# Patient Record
Sex: Male | Born: 1960 | Race: White | Hispanic: No | Marital: Married | Smoking: Never smoker
Health system: Southern US, Community
[De-identification: ages and names within clinical notes are randomized; demographics above are authoritative.]

## PROBLEM LIST (undated history)

## (undated) DIAGNOSIS — H6690 Otitis media, unspecified, unspecified ear: Secondary | ICD-10-CM

## (undated) DIAGNOSIS — I10 Essential (primary) hypertension: Secondary | ICD-10-CM

## (undated) DIAGNOSIS — Z9109 Other allergy status, other than to drugs and biological substances: Secondary | ICD-10-CM

## (undated) DIAGNOSIS — F1010 Alcohol abuse, uncomplicated: Secondary | ICD-10-CM

---

## 2015-05-30 ENCOUNTER — Ambulatory Visit
Admission: EM | Admit: 2015-05-30 | Discharge: 2015-05-30 | Disposition: A | Discharge status: Home / Self Care | Payer: BLUE CROSS/BLUE SHIELD | Attending: Family Medicine | Admitting: Family Medicine

## 2015-05-30 ENCOUNTER — Encounter: Payer: Self-pay | Admitting: *Deleted

## 2015-05-30 DIAGNOSIS — I10 Essential (primary) hypertension: Secondary | ICD-10-CM | POA: Diagnosis not present

## 2015-05-30 DIAGNOSIS — T50905A Adverse effect of unspecified drugs, medicaments and biological substances, initial encounter: Secondary | ICD-10-CM

## 2015-05-30 DIAGNOSIS — J302 Other seasonal allergic rhinitis: Secondary | ICD-10-CM | POA: Diagnosis not present

## 2015-05-30 DIAGNOSIS — I158 Other secondary hypertension: Secondary | ICD-10-CM

## 2015-05-30 HISTORY — DX: Otitis media, unspecified, unspecified ear: H66.90

## 2015-05-30 HISTORY — DX: Other allergy status, other than to drugs and biological substances: Z91.09

## 2015-05-30 NOTE — ED Notes (Addendum)
Patient has not be feeling well for the past week and with some light headiness and today he is feeling a little more light headed. Patient does suffer from seasonal allergies and maybe suffering from the effects of flowers in early bloom. Patient has had severe ear infections in the past and is presently feeling some ear pain on the left side.

## 2015-05-30 NOTE — ED Provider Notes (Signed)
CSN: [id number]     Arrival date & time 05/30/15  1941 History   First MD Initiated Contact with Patient 05/30/15 2232     Chief Complaint  Patient presents with  . Hypertension   (Consider location/radiation/quality/duration/timing/severity/associated sxs/prior Treatment) HPI   Patient presents along with his wife for complaint hasn't been feeling well for the past week with complaints of lightheadedness and today of feeling of more lightheadedness with some tingling in his right upper arm. He has an extensive past history of allergies and recently started taking Allegra-D. Staking the Allegra-D he has noticed his symptoms as well as red blood pressures that he is taken at stores. Became alarmed and decided to come in with his wife. He's been taking the Allegra-D along with over-the-counter nasal sprays mostly saline because of his severe allergies. 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  Past Medical History  Diagnosis Date  . Environmental allergies   . Ear infection    History reviewed. No pertinent past surgical history. Family History  Problem Relation Age of Onset  . Hypertension Mother   . Hypertension Father    Social History  Substance Use Topics  . Smoking status: Never Smoker   . Smokeless tobacco: Current User  . Alcohol Use: Yes    Review of Systems  Constitutional: Positive for activity change. Negative for fever, chills and fatigue.  HENT: Positive for congestion and ear pain.   Allergic/Immunologic: Positive for environmental allergies.  All other systems reviewed and are negative.   Allergies  Review of patient's allergies indicates no known allergies.  Home Medications   Prior to Admission medications   Not on File   Meds Ordered and Administered this Visit  Medications - No data to display  BP 178/93  mmHg  Pulse 93  Temp(Src) 98.1 F (36.7 C) (Oral)  Resp 18  Ht  (1.702 m)  Wt 160 lb (72.576 kg)  BMI 25.05 kg/m2  SpO2 97% No data found.   Physical Exam  Constitutional: He is oriented to person, place, and time. He appears well-developed and well-nourished. No distress.  HENT:  Head: Normocephalic and atraumatic.  Nose: Nose normal.  Mouth/Throat: Oropharynx is clear and moist. No oropharyngeal exudate.  Both TMs are dull  Eyes: Conjunctivae are normal. Pupils are equal, round, and reactive to light.  Neck: Normal range of motion. Neck supple.  Musculoskeletal: Normal range of motion. He exhibits no edema or tenderness.  Neurological: He is alert and oriented to person, place, and time.  Skin: Skin is warm and dry. He is not diaphoretic.  Psychiatric: He has a normal mood and affect. His behavior is normal. Judgment and thought content normal.  Nursing note and vitals reviewed.   ED Course  Procedures (including critical care time)  Labs Review Labs Reviewed - No data to display  Imaging Review No results found.   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 Seasonal allergic rhinitis    Discussion with the patient and his wife. It is likely that his hypertension is from the effects of the  Allegra-D pseudoephedrine component. I recommended that he stop this immediately and to monitor his blood pressures on a 3 times a week basis only. For his seasonal allergies but also recommended highly that he consider using Flonase and Zyrtec plain along with a Nettie pot once every day prior to using the Flonase. His pressures do not come down I recommended that he be seen by a primary care physician for evaluation and treatment as necessary. His ear problem also recommended that he will return to his ENT to allow them to  reevaluate him and his hearing. No prescription medications were provided since everything is over-the-counter that I recommended.    Lutricia Feil, PA-C 05/30/15 2251

## 2015-05-30 NOTE — Discharge Instructions (Signed)
Allergic Rhinitis Allergic rhinitis is when the mucous membranes in the nose respond to allergens. Allergens are particles in the air that cause your body to have an allergic reaction. This causes you to release allergic antibodies. Through a chain of events, these eventually cause you to release histamine into the blood stream. Although meant to protect the body, it is this release of histamine that causes your discomfort, such as frequent sneezing, congestion, and an itchy, runny nose.  CAUSES Seasonal allergic rhinitis (hay fever) is caused by pollen allergens that may come from grasses, trees, and weeds. Year-round allergic rhinitis (perennial allergic rhinitis) is caused by allergens such as house dust mites, pet dander, and mold spores. SYMPTOMS  Nasal stuffiness (congestion).  Itchy, runny nose with sneezing and tearing of the eyes. DIAGNOSIS Your health care provider can help you determine the allergen or allergens that trigger your symptoms. If you and your health care provider are unable to determine the allergen, skin or blood testing may be used. Your health care provider will diagnose your condition after taking your health history and performing a physical exam. Your health care provider may assess you for other related conditions, such as asthma, pink eye, or an ear infection. TREATMENT Allergic rhinitis does not have a cure, but it can be controlled by:  Medicines that block allergy symptoms. These may include allergy shots, nasal sprays, and oral antihistamines.  Avoiding the allergen. Hay fever may often be treated with antihistamines in pill or nasal spray forms. Antihistamines block the effects of histamine. There are over-the-counter medicines that may help with nasal congestion and swelling around the eyes. Check with your health care provider before taking or giving this medicine. If avoiding the allergen or the medicine prescribed do not work, there are many new medicines  your health care provider can prescribe. Stronger medicine may be used if initial measures are ineffective. Desensitizing injections can be used if medicine and avoidance does not work. Desensitization is when a patient is given ongoing shots until the body becomes less sensitive to the allergen. Make sure you follow up with your health care provider if problems continue. HOME CARE INSTRUCTIONS It is not possible to completely avoid allergens, but you can reduce your symptoms by taking steps to limit your exposure to them. It helps to know exactly what you are allergic to so that you can avoid your specific triggers. SEEK MEDICAL CARE IF:  You have a fever.  You develop a cough that does not stop easily (persistent).  You have shortness of breath.  You start wheezing.  Symptoms interfere with normal daily activities.   This information is not intended to replace advice given to you by your health care provider. Make sure you discuss any questions you have with your health care provider.   Document Released: 12/22/2000 Document Revised: 04/19/2014 Document Reviewed: 12/04/2012 Elsevier Interactive Patient Education [employer]  Allergies An allergy is an abnormal reaction to a substance by the body's defense system (immune system). Allergies can develop at any age. WHAT CAUSES ALLERGIES? An allergic reaction happens when the immune system mistakenly reacts to a normally harmless substance, called an allergen, as if it were harmful. The immune system releases antibodies to fight the substance. Antibodies eventually release a chemical called histamine into the bloodstream. The release of histamine is meant to protect the body from infection, but it also causes discomfort. An allergic reaction can be triggered by:  Eating an allergen.  Inhaling an allergen.  Touching  an allergen. WHAT TYPES OF ALLERGIES ARE THERE? There are many types of allergies. Common types  include:  Seasonal allergies. People with this type of allergy are usually allergic to substances that are only present during certain seasons, such as molds and pollens.  Food allergies.  Drug allergies.  Insect allergies.  Animal dander allergies. WHAT ARE SYMPTOMS OF ALLERGIES? Possible allergy symptoms include:  Swelling of the lips, face, tongue, mouth, or throat.  Sneezing, coughing, or wheezing.  Nasal congestion.  Tingling in the mouth.  Rash.  Itching.  Itchy, red, swollen areas of skin (hives).  Watery eyes.  Vomiting.  Diarrhea.  Dizziness.  Lightheadedness.  Fainting.  Trouble breathing or swallowing.  Chest tightness.  Rapid heartbeat. HOW ARE ALLERGIES DIAGNOSED? Allergies are diagnosed with a medical and family history and one or more of the following:  Skin tests.  Blood tests.  A food diary. A food diary is a record of all the foods and drinks you have in a day and of all the symptoms you experience.  The results of an elimination diet. An elimination diet involves eliminating foods from your diet and then adding them back in one by one to find out if a certain food causes an allergic reaction. HOW ARE ALLERGIES TREATED? There is no cure for allergies, but allergic reactions can be treated with medicine. Severe reactions usually need to be treated at a hospital. HOW CAN REACTIONS BE PREVENTED? The best way to prevent an allergic reaction is by avoiding the substance you are allergic to. Allergy shots and medicines can also help prevent reactions in some cases. People with severe allergic reactions may be able to prevent a life-threatening reaction called anaphylaxis with a medicine given right after exposure to the allergen.   This information is not intended to replace advice given to you by your health care provider. Make sure you discuss any questions you have with your health care provider.   Document Released: 06/22/2002 Document  Revised: 04/19/2014 Document Reviewed: 01/08/2014 Elsevier Interactive Patient Education 2016 [employer].  Hypertension Hypertension, commonly called high blood pressure, is when the force of blood pumping through your arteries is too strong. Your arteries are the blood vessels that carry blood from your heart throughout your body. A blood pressure reading consists of a higher number over a lower number, such as 110/72. The higher number (systolic) is the pressure inside your arteries when your heart pumps. The lower number (diastolic) is the pressure inside your arteries when your heart relaxes. Ideally you want your blood pressure below 120/80. Hypertension forces your heart to work harder to pump blood. Your arteries may become narrow or stiff. Having untreated or uncontrolled hypertension can cause heart attack, stroke, kidney disease, and other problems. RISK FACTORS Some risk factors for high blood pressure are controllable. Others are not.  Risk factors you cannot control include:   Race. You may be at higher risk if you are African American.  Age. Risk increases with age.  Gender. Men are at higher risk than women before age 8 years. After age 64, women are at higher risk than men. Risk factors you can control include:  Not getting enough exercise or physical activity.  Being overweight.  Getting too much fat, sugar, calories, or salt in your diet.  Drinking too much alcohol. SIGNS AND SYMPTOMS Hypertension does not usually cause signs or symptoms. Extremely high blood pressure (hypertensive crisis) may cause headache, anxiety, shortness of breath, and nosebleed. DIAGNOSIS To check if  you have hypertension, your health care provider will measure your blood pressure while you are seated, with your arm held at the level of your heart. It should be measured at least twice using the same arm. Certain conditions can cause a difference in blood pressure between your right and left  arms. A blood pressure reading that is higher than normal on one occasion does not mean that you need treatment. If it is not clear whether you have high blood pressure, you may be asked to return on a different day to have your blood pressure checked again. Or, you may be asked to monitor your blood pressure at home for 1 or more weeks. TREATMENT Treating high blood pressure includes making lifestyle changes and possibly taking medicine. Living a healthy lifestyle can help lower high blood pressure. You may need to change some of your habits. Lifestyle changes may include:  Following the DASH diet. This diet is high in fruits, vegetables, and whole grains. It is low in salt, red meat, and added sugars.  Keep your sodium intake below 2,300 mg per day.  Getting at least 30-45 minutes of aerobic exercise at least 4 times per week.  Losing weight if necessary.  Not smoking.  Limiting alcoholic beverages.  Learning ways to reduce stress. Your health care provider may prescribe medicine if lifestyle changes are not enough to get your blood pressure under control, and if one of the following is true:  You are 5-50 years of age and your systolic blood pressure is above 140.  You are 51 years of age or older, and your systolic blood pressure is above 150.  Your diastolic blood pressure is above 90.  You have diabetes, and your systolic blood pressure is over 140 or your diastolic blood pressure is over 90.  You have kidney disease and your blood pressure is above 140/90.  You have heart disease and your blood pressure is above 140/90. Your personal target blood pressure may vary depending on your medical conditions, your age, and other factors. HOME CARE INSTRUCTIONS  Have your blood pressure rechecked as directed by your health care provider.   Take medicines only as directed by your health care provider. Follow the directions carefully. Blood pressure medicines must be taken as  prescribed. The medicine does not work as well when you skip doses. Skipping doses also puts you at risk for problems.  Do not smoke.   Monitor your blood pressure at home as directed by your health care provider. SEEK MEDICAL CARE IF:   You think you are having a reaction to medicines taken.  You have recurrent headaches or feel dizzy.  You have swelling in your ankles.  You have trouble with your vision. SEEK IMMEDIATE MEDICAL CARE IF:  You develop a severe headache or confusion.  You have unusual weakness, numbness, or feel faint.  You have severe chest or abdominal pain.  You vomit repeatedly.  You have trouble breathing. MAKE SURE YOU:   Understand these instructions.  Will watch your condition.  Will get help right away if you are not doing well or get worse.   This information is not intended to replace advice given to you by your health care provider. Make sure you discuss any questions you have with your health care provider.   Document Released: 03/29/2005 Document Revised: 08/13/2014 Document Reviewed: 01/19/2013 Elsevier Interactive Patient Education [employer]

## 2016-06-05 ENCOUNTER — Emergency Department
Admission: EM | Admit: 2016-06-05 | Discharge: 2016-06-05 | Disposition: A | Discharge status: Home / Self Care | Payer: BLUE CROSS/BLUE SHIELD | Attending: Emergency Medicine | Admitting: Emergency Medicine

## 2016-06-05 DIAGNOSIS — I10 Essential (primary) hypertension: Secondary | ICD-10-CM | POA: Diagnosis not present

## 2016-06-05 DIAGNOSIS — R42 Dizziness and giddiness: Secondary | ICD-10-CM | POA: Diagnosis present

## 2016-06-05 DIAGNOSIS — R791 Abnormal coagulation profile: Secondary | ICD-10-CM | POA: Diagnosis not present

## 2016-06-05 DIAGNOSIS — F17200 Nicotine dependence, unspecified, uncomplicated: Secondary | ICD-10-CM | POA: Insufficient documentation

## 2016-06-05 LAB — COMPREHENSIVE METABOLIC PANEL
ALBUMIN: 4.5 g/dL (ref 3.5–5.0)
ALK PHOS: 94 U/L (ref 38–126)
ALT: 37 U/L (ref 17–63)
ANION GAP: 10 (ref 5–15)
AST: 34 U/L (ref 15–41)
BUN: 18 mg/dL (ref 6–20)
CO2: 26 mmol/L (ref 22–32)
Calcium: 9.1 mg/dL (ref 8.9–10.3)
Chloride: 101 mmol/L (ref 101–111)
Creatinine, Ser: 1.06 mg/dL (ref 0.61–1.24)
GFR calc Af Amer: >60 mL/min (ref >60)
GFR calc non Af Amer: >60 mL/min (ref >60)
GLUCOSE: 150 mg/dL — AB (ref 65–99)
POTASSIUM: 3.6 mmol/L (ref 3.5–5.1)
SODIUM: 137 mmol/L (ref 135–145)
Total Bilirubin: 0.6 mg/dL (ref 0.3–1.2)
Total Protein: 7.5 g/dL (ref 6.5–8.1)

## 2016-06-05 LAB — CBC
HCT: 43.2 % (ref 40.0–52.0)
Hemoglobin: 15.3 g/dL (ref 13.0–18.0)
MCH: 35.5 pg — AB (ref 26.0–34.0)
MCHC: 35.3 g/dL (ref 32.0–36.0)
MCV: 100.7 fL — ABNORMAL HIGH (ref 80.0–100.0)
PLATELETS: 234 10*3/uL (ref 150–440)
RBC: 4.29 MIL/uL — AB (ref 4.40–5.90)
RDW: 13.5 % (ref 11.5–14.5)
WBC: 6.1 10*3/uL (ref 3.8–10.6)

## 2016-06-05 LAB — DIFFERENTIAL
Basophils Absolute: 0.1 10*3/uL (ref 0–0.1)
Basophils Relative: 1 %
EOS PCT: 4 %
Eosinophils Absolute: 0.2 10*3/uL (ref 0–0.7)
LYMPHS PCT: 41 %
Lymphs Abs: 2.5 10*3/uL (ref 1.0–3.6)
Monocytes Absolute: 0.7 10*3/uL (ref 0.2–1.0)
Monocytes Relative: 12 %
NEUTROS PCT: 42 %
Neutro Abs: 2.6 10*3/uL (ref 1.4–6.5)

## 2016-06-05 LAB — APTT: APTT: 26 s (ref 24–36)

## 2016-06-05 LAB — PROTIME-INR
INR: 0.86
Prothrombin Time: 11.7 seconds (ref 11.4–15.2)

## 2016-06-05 LAB — TROPONIN I: Troponin I: <0.03 ng/mL (ref <0.03)

## 2016-06-05 MED ORDER — HYDROCHLOROTHIAZIDE 25 MG PO TABS: 25.0000 mg | ORAL_TABLET | Freq: Every day | ORAL | 0 refills | Status: AC

## 2016-06-05 MED ORDER — HYDRALAZINE HCL 20 MG/ML IJ SOLN
5.0000 mg | Freq: Once | INTRAMUSCULAR | Status: AC
  Administered 2016-06-05: 5 mg via INTRAVENOUS
  Filled 2016-06-05: qty 1

## 2016-06-05 MED ORDER — LABETALOL HCL 5 MG/ML IV SOLN
10.0000 mg | Freq: Once | INTRAVENOUS | Status: DC
Start: 2016-06-05 — End: 2016-06-05
  Filled 2016-06-05: qty 4

## 2016-06-05 MED ORDER — SODIUM CHLORIDE 0.9 % IV SOLN
1000.0000 mL | Freq: Once | INTRAVENOUS | Status: AC
  Administered 2016-06-05: 1000 mL via INTRAVENOUS

## 2016-06-05 NOTE — ED Triage Notes (Signed)
[place] diz[place] after he arrived; dizziness started after he got of the car and started walking; c/o pressure in his head; pt pauses before answering, which wife says is his normal communication, but no slurred speech or difficulty speaking; pt awake and alert; taken to treatment room 15 via wheelchair; pt hypertensive in triage, which he has a history of, but says it's never been that high

## 2016-06-05 NOTE — ED Notes (Signed)
Spoke with dr. Cyril Loosenkinner regarding pt's hr in upper 50s to low 60s and order for labetalol. md discontinued labetalol and ordered hydralazine after discussion with this rn.

## 2016-06-05 NOTE — ED Provider Notes (Signed)
[facility] Emergency Department Provider Note   ____________________________________________    I have reviewed the triage vital signs and the nursing notes.   HISTORY  Chief Complaint Dizziness     HPI Vernon Bentley is a 56 y.o. male who presents with dizziness. Patient reports he developed lightheadedness after getting out of his car to go watch his niece's dance recital. He reports he felt lightheaded while sitting and watching after about 10 minutes he and his wife decided he needed to be evaluated. He denies chest pain, palpitations, shortness of breath. No calf pain or swelling. No recent travel. He has never had this before. He does have a history of vertigo but he reports this is different. No neuro deficits. No fevers or chills.   Past Medical History:  Diagnosis Date  . Ear infection   . Environmental allergies     There are no active problems to display for this patient.   No past surgical history on file.  Prior to Admission medications   Medication Sig Start Date End Date Taking? Authorizing Provider  hydrochlorothiazide (HYDRODIURIL) 25 MG tablet Take 1 tablet (25 mg total) by mouth daily. 06/05/16   Jene Every, MD     Allergies Patient has no known allergies.  Family History  Problem Relation Age of Onset  . Hypertension Mother   . Hypertension Father     Social History Social History  Substance Use Topics  . Smoking status: Never Smoker  . Smokeless tobacco: Current User  . Alcohol use Yes    Review of Systems  Constitutional: No fever/chills Eyes: No visual changes.   Cardiovascular: Denies chest pain. Respiratory: Denies shortness of breath. Gastrointestinal: No abdominal pain.  No nausea, no vomiting.    Musculoskeletal: Negative for back pain. Skin: Negative for rash. Neurological: Negative for headaches or weakness  10-point ROS otherwise  negative.  ____________________________________________   PHYSICAL EXAM:  VITAL SIGNS: ED Triage Vitals  Enc Vitals Group     BP 06/05/16 2008 (!) 208/93     Pulse Rate 06/05/16 2008 75     Resp 06/05/16 2008 18     Temp 06/05/16 2008 98.3 F (36.8 C)     Temp Source 06/05/16 2008 Oral     SpO2 06/05/16 2008 99 %     Weight 06/05/16 2008 165 lb (74.8 kg)     Height 06/05/16 2008 5\' 7"  (1.702 m)     Head Circumference --      Peak Flow --      Pain Score 06/05/16 2009 7     Pain Loc --      Pain Edu? --      Excl. in GC? --     Constitutional: Alert and oriented. No acute distress. Pleasant and interactive Eyes: Conjunctivae are normal.   Nose: No congestion/rhinnorhea. Mouth/Throat: Mucous membranes are moist.    Cardiovascular: Normal rate, regular rhythm. Grossly normal heart sounds.  Good peripheral circulation. Respiratory: Normal respiratory effort.  No retractions. Lungs CTAB. Gastrointestinal: Soft and nontender. No distention.  No CVA tenderness. Genitourinary: deferred Musculoskeletal: No lower extremity tenderness nor edema.  Warm and well perfused Neurologic:  Normal speech and language. No gross focal neurologic deficits are appreciated.  Skin:  Skin is warm, dry and intact. No rash noted. Psychiatric: Mood and affect are normal. Speech and behavior are normal.  ____________________________________________   LABS (all labs ordered are listed, but only abnormal results are displayed)  Labs Reviewed  CBC -  Abnormal; Notable for the following:       Result Value   RBC 4.29 (*)    MCV 100.7 (*)    MCH 35.5 (*)    All other components within normal limits  COMPREHENSIVE METABOLIC PANEL - Abnormal; Notable for the following:    Glucose, Bld 150 (*)    All other components within normal limits  DIFFERENTIAL  TROPONIN I  PROTIME-INR  APTT   ____________________________________________  EKG  ED ECG REPORT I, Jene EveryKINNER, Aleisa Howk, the attending  physician, personally viewed and interpreted this ECG.  Date: 06/05/2016  Rhythm: normal sinus rhythm QRS Axis: normal Intervals: normal ST/T Wave abnormalities: normal Conduction Disturbances: none Narrative Interpretation: unremarkable  ____________________________________________  RADIOLOGY  None ____________________________________________   PROCEDURES  Procedure(s) performed: No    Critical Care performed: No ____________________________________________   INITIAL IMPRESSION / ASSESSMENT AND PLAN / ED COURSE  Pertinent labs & imaging results that were available during my care of the patient were reviewed by me and considered in my medical decision making (see chart for details).  Patient presents with lightheadedness. EKG is reassuring. He is hypertensive and denies a history of such but he has not had it checked recently and does not see a PCP routinely. No chest pain palpitations or arrhythmia. Lab work is reassuring. He was treated with a liter of IV fluids and reports "dramatic improvement ". His blood pressure is improved with IV hydralazine. I'll discharge with hydrochlorothiazide instructions to follow-up with PCP for blood pressure recheck. Return precautions discussed, patient and wife feel comfortable with the plan    ____________________________________________   FINAL CLINICAL IMPRESSION(S) / ED DIAGNOSES  Final diagnoses:  Lightheadedness  Hypertension, unspecified type      NEW MEDICATIONS STARTED DURING THIS VISIT:  Discharge Medication List as of 06/05/2016 10:40 PM    START taking these medications   Details  hydrochlorothiazide (HYDRODIURIL) 25 MG tablet Take 1 tablet (25 mg total) by mouth daily., Starting Sat 06/05/2016, Print         Note:  This document was prepared using Dragon voice recognition software and may include unintentional dictation errors.    Jene Everyobert Niveah Boerner, MD 06/05/16 [phone]

## 2017-08-27 ENCOUNTER — Other Ambulatory Visit: Payer: Self-pay

## 2017-08-27 ENCOUNTER — Encounter: Payer: Self-pay | Admitting: Emergency Medicine

## 2017-08-27 ENCOUNTER — Emergency Department
Admission: EM | Admit: 2017-08-27 | Discharge: 2017-08-27 | Disposition: A | Discharge status: Home / Self Care | Payer: BLUE CROSS/BLUE SHIELD | Attending: Emergency Medicine | Admitting: Emergency Medicine

## 2017-08-27 ENCOUNTER — Emergency Department: Payer: BLUE CROSS/BLUE SHIELD

## 2017-08-27 DIAGNOSIS — Z79899 Other long term (current) drug therapy: Secondary | ICD-10-CM | POA: Diagnosis not present

## 2017-08-27 DIAGNOSIS — E860 Dehydration: Secondary | ICD-10-CM | POA: Diagnosis not present

## 2017-08-27 DIAGNOSIS — I10 Essential (primary) hypertension: Secondary | ICD-10-CM | POA: Diagnosis not present

## 2017-08-27 DIAGNOSIS — F10220 Alcohol dependence with intoxication, uncomplicated: Secondary | ICD-10-CM | POA: Diagnosis not present

## 2017-08-27 DIAGNOSIS — R55 Syncope and collapse: Secondary | ICD-10-CM | POA: Diagnosis not present

## 2017-08-27 DIAGNOSIS — R110 Nausea: Secondary | ICD-10-CM | POA: Insufficient documentation

## 2017-08-27 DIAGNOSIS — F17228 Nicotine dependence, chewing tobacco, with other nicotine-induced disorders: Secondary | ICD-10-CM | POA: Diagnosis not present

## 2017-08-27 DIAGNOSIS — R42 Dizziness and giddiness: Secondary | ICD-10-CM | POA: Insufficient documentation

## 2017-08-27 HISTORY — DX: Alcohol abuse, uncomplicated: F10.10

## 2017-08-27 HISTORY — DX: Essential (primary) hypertension: I10

## 2017-08-27 LAB — COMPREHENSIVE METABOLIC PANEL
ALBUMIN: 4.5 g/dL (ref 3.5–5.0)
ALK PHOS: 74 U/L (ref 38–126)
ALT: 71 U/L — AB (ref 17–63)
AST: 57 U/L — AB (ref 15–41)
Anion gap: 15 (ref 5–15)
BILIRUBIN TOTAL: 0.9 mg/dL (ref 0.3–1.2)
BUN: 25 mg/dL — AB (ref 6–20)
CALCIUM: 8.9 mg/dL (ref 8.9–10.3)
CO2: 23 mmol/L (ref 22–32)
CREATININE: 1.13 mg/dL (ref 0.61–1.24)
Chloride: 96 mmol/L — ABNORMAL LOW (ref 101–111)
GFR calc Af Amer: >60 mL/min (ref >60)
GLUCOSE: 90 mg/dL (ref 65–99)
POTASSIUM: 4 mmol/L (ref 3.5–5.1)
Sodium: 134 mmol/L — ABNORMAL LOW (ref 135–145)
TOTAL PROTEIN: 7.4 g/dL (ref 6.5–8.1)

## 2017-08-27 LAB — URINALYSIS, COMPLETE (UACMP) WITH MICROSCOPIC
Bacteria, UA: NONE SEEN
Bilirubin Urine: NEGATIVE
GLUCOSE, UA: NEGATIVE mg/dL
Hgb urine dipstick: NEGATIVE
Ketones, ur: 20 mg/dL — AB
Leukocytes, UA: NEGATIVE
Nitrite: NEGATIVE
PH: 6 (ref 5.0–8.0)
Protein, ur: NEGATIVE mg/dL
SPECIFIC GRAVITY, URINE: 1.015 (ref 1.005–1.030)
Squamous Epithelial / LPF: NONE SEEN (ref 0–5)

## 2017-08-27 LAB — URINE DRUG SCREEN, QUALITATIVE (ARMC ONLY)
Amphetamines, Ur Screen: NOT DETECTED
BARBITURATES, UR SCREEN: NOT DETECTED
Benzodiazepine, Ur Scrn: NOT DETECTED
CANNABINOID 50 NG, UR ~~LOC~~: NOT DETECTED
Cocaine Metabolite,Ur ~~LOC~~: NOT DETECTED
MDMA (ECSTASY) UR SCREEN: NOT DETECTED
Methadone Scn, Ur: NOT DETECTED
Opiate, Ur Screen: NOT DETECTED
PHENCYCLIDINE (PCP) UR S: NOT DETECTED
TRICYCLIC, UR SCREEN: NOT DETECTED

## 2017-08-27 LAB — CBC
HCT: 38.5 % — ABNORMAL LOW (ref 40.0–52.0)
HEMOGLOBIN: 13.8 g/dL (ref 13.0–18.0)
MCH: 35.6 pg — AB (ref 26.0–34.0)
MCHC: 35.8 g/dL (ref 32.0–36.0)
MCV: 99.4 fL (ref 80.0–100.0)
PLATELETS: 222 10*3/uL (ref 150–440)
RBC: 3.87 MIL/uL — AB (ref 4.40–5.90)
RDW: 16.7 % — ABNORMAL HIGH (ref 11.5–14.5)
WBC: 4.6 10*3/uL (ref 3.8–10.6)

## 2017-08-27 LAB — ETHANOL: Alcohol, Ethyl (B): 106 mg/dL — ABNORMAL HIGH (ref <10)

## 2017-08-27 MED ORDER — CHLORDIAZEPOXIDE HCL 25 MG PO CAPS: ORAL_CAPSULE | ORAL | 0 refills | Status: AC

## 2017-08-27 MED ORDER — THIAMINE HCL 100 MG/ML IJ SOLN
Freq: Once | INTRAVENOUS | Status: AC
  Administered 2017-08-27: 17:00:00 via INTRAVENOUS
  Filled 2017-08-27: qty 1000

## 2017-08-27 NOTE — ED Triage Notes (Addendum)
Pt reports has been feeling [place]  Has had nausea.  Denies SHOB or vomiting.  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  Has been drinking a pint of vodka every day to two days.  Last drink yesterday early afternoon.  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  Denies SI at this time.  Reports did drink some water at street [place] but not a lot.

## 2017-08-27 NOTE — ED Provider Notes (Signed)
[facility] Emergency Department Provider Note  ____________________________________________  Time seen: Approximately 5:18 PM  I have reviewed the triage vital signs and the nursing notes.   HISTORY  Chief Complaint Dizziness   HPI Vernon Bentley is a 57 y.o. male with a history of alcohol abuse and hypertension who presents for evaluation of near syncopal episode.  [place]  It was 53F degrees outside and he barely had anything to drink. He was out for a few hours when he started feeling dizzy/ lightheaded and nauseated like he was going to pass out.  He sat down for a while but his symptoms did not get better which prompted his visit to the emergency room.  Patient denies LOC.  He denies headache, chest pain, shortness of breath, abdominal pain, nausea, vomiting, diarrhea.  Patient reports that 3 weeks ago he relapsed after being sober for a month.  He drinks a pint of vodka every 1 to 2 days.  His last drink was yesterday evening.  He has a history of anxiety and tremors with detox but never had seizures.  Patient has been admitted several times to private inpatient detox facilities and usually spends several weeks there before being discharged home.  Patient denies anxiety, shakes, hallucinations, nausea, headache at this time.  He denies any other drug use.   Past Medical History:  Diagnosis Date  . Alcohol abuse   . Ear infection   . Environmental allergies   . Hypertension     Prior to Admission medications   Medication Sig Start Date End Date Taking? Authorizing Provider  chlordiazePOXIDE (LIBRIUM) 25 MG capsule 50 mg 3 times a day x 3 days 50 mg 2 times a day for 3 days 50 mg once a day for 3 days stop 08/27/17   Don Perking, [place], MD  hydrochlorothiazide (HYDRODIURIL) 25 MG tablet Take 1 tablet (25 mg total) by mouth daily. 06/05/16   Jene Every, MD    Allergies Patient has no known allergies.  Family  History  Problem Relation Age of Onset  . Hypertension Mother   . Hypertension Father     Social History Social History   Tobacco Use  . Smoking status: Never Smoker  . Smokeless tobacco: Current User  Substance Use Topics  . Alcohol use: Yes  . Drug use: No    Review of Systems  Constitutional: Negative for fever. + near syncope Eyes: Negative for visual changes. ENT: Negative for sore throat. Neck: No neck pain  Cardiovascular: Negative for chest pain. Respiratory: Negative for shortness of breath. Gastrointestinal: Negative for abdominal pain, vomiting or diarrhea. Genitourinary: Negative for dysuria. Musculoskeletal: Negative for back pain. Skin: Negative for rash. Neurological: Negative for headaches, weakness or numbness. Psych: No SI or HI  ____________________________________________   PHYSICAL EXAM:  VITAL SIGNS: ED Triage Vitals  Enc Vitals Group     BP 08/27/17 1623 133/60     Pulse Rate 08/27/17 1623 74     Resp 08/27/17 1623 16     Temp 08/27/17 1623 98.4 F (36.9 C)     Temp Source 08/27/17 1623 Oral     SpO2 08/27/17 1623 99 %     Weight 08/27/17 1624 155 lb (70.3 kg)     Height 08/27/17 1624  (1.702 m)     Head Circumference --      Peak Flow --      Pain Score 08/27/17 1624 0  Pain Loc --      Pain Edu? --      Excl. in GC? --     Constitutional: Alert and oriented. Well appearing and in no apparent distress. HEENT:      Head: Normocephalic and atraumatic.         Eyes: Conjunctivae are normal. Sclera is non-icteric.       Mouth/Throat: Mucous membranes are moist.       Neck: Supple with no signs of meningismus. Cardiovascular: Regular rate and rhythm. No murmurs, gallops, or rubs. 2+ symmetrical distal pulses are present in all extremities. No JVD. Respiratory: Normal respiratory effort. Lungs are clear to auscultation bilaterally. No wheezes, crackles, or rhonchi.  Gastrointestinal: Soft, non tender, and non distended with  positive bowel sounds. No rebound or guarding. Musculoskeletal: Nontender with normal range of motion in all extremities. No edema, cyanosis, or erythema of extremities. There is deformity of the R little finger at PIP joint Neurologic: Normal speech and language. Face is symmetric. Moving all extremities. No gross focal neurologic deficits are appreciated. Skin: Skin is warm, dry and intact. No rash noted. Psychiatric: Mood and affect are normal. Speech and behavior are normal.  ____________________________________________   LABS (all labs ordered are listed, but only abnormal results are displayed)  Labs Reviewed  CBC - Abnormal; Notable for the following components:      Result Value   RBC 3.87 (*)    HCT 38.5 (*)    MCH 35.6 (*)    RDW 16.7 (*)    All other components within normal limits  URINALYSIS, COMPLETE (UACMP) WITH MICROSCOPIC - Abnormal; Notable for the following components:   Color, Urine YELLOW (*)    APPearance CLEAR (*)    Ketones, ur 20 (*)    All other components within normal limits  ETHANOL - Abnormal; Notable for the following components:   Alcohol, Ethyl (B) 106 (*)    All other components within normal limits  COMPREHENSIVE METABOLIC PANEL - Abnormal; Notable for the following components:   Sodium 134 (*)    Chloride 96 (*)    BUN 25 (*)    AST 57 (*)    ALT 71 (*)    All other components within normal limits  URINE DRUG SCREEN, QUALITATIVE ([facility] ONLY)   ____________________________________________  EKG  ED ECG REPORT I, Nita Sickle, the attending physician, personally viewed and interpreted this ECG.  Normal sinus rhythm, rate of 64, normal intervals, normal axis, no ST elevations or depressions.  Normal EKG. ____________________________________________  RADIOLOGY  none  ____________________________________________   PROCEDURES  Procedure(s) performed: None Procedures Critical Care performed:   None ____________________________________________   INITIAL IMPRESSION / ASSESSMENT AND PLAN / ED COURSE  57 y.o. male with a history of alcohol abuse and hypertension who presents for evaluation of near syncopal episode.  Patient is extremely well-appearing, no distress, has normal vital signs, neurologically intact, EKG with no evidence of arrhythmia or ischemia.  Differential diagnosis is broad and includes dehydration, vasovagal, possible electrolyte abnormalities causing cardiac arrhythmia since patient is an alcoholic.  Orthostatic vital signs were negative.  There are no clinical signs of ACS or stroke at this time.  Will check labs to rule out electrolyte abnormalities, anemia, dehydration, acute kidney injury.no clinical signs of withdrawal, CIWA 0. Will give a banana bag and monitor on telemetry.    _________________________ 6:50 PM on 08/27/2017 ----------------------------------------- Labs showing mild ketones, negative drug screen, no evidence of anemia or electrolyte abnormalities, alcohol level  of 106, mildly elevated LFTs.  Patient received a liter of banana bag. He continues to have CIWA of 0 with no signs of withdrawal.  No dysrhythmias observed on telemetry.  At this time, he is stable for discharge.  I will provide his wife with a prescription for Librium taper so patient can start detox over the weekend until the wife can find him placement on Monday.  I explained to the patient and the wife that mixing alcohol with Librium can cause severe respiratory depression which can cause death.  The wife says that she will keep the prescription with herself and monitor patient.  Patient is aware of the dangers and understands the risks of mixing both of him.  I also explained to him that his liver is already showing signs of injury from alcohol abuse.  I explained to the patient what happens when patients develop cirrhosis and I highly encourage him to try to remain sober.  Discussed signs  and symptoms of alcohol withdrawal and recommended return to the emergency room if these develop.  At this time patient is stable for discharge home to the care of his wife.    As part of my medical decision making, I reviewed the following data within the electronic MEDICAL RECORD NUMBER History obtained from family, Nursing notes reviewed and incorporated, Labs reviewed , EKG interpreted , Old chart reviewed, Notes from prior ED visits and [place] Controlled Substance Database    Pertinent labs & imaging results that were available during my care of the patient were reviewed by me and considered in my medical decision making (see chart for details).    ____________________________________________   FINAL CLINICAL IMPRESSION(S) / ED DIAGNOSES  Final diagnoses:  Near syncope  Dehydration  Alcohol dependence with acute alcoholic intoxication without complication (HCC)      NEW MEDICATIONS STARTED DURING THIS VISIT:  ED Discharge Orders        Ordered    chlordiazePOXIDE (LIBRIUM) 25 MG capsule     08/27/17 1850       Note:  This document was prepared using Dragon voice recognition software and may include unintentional dictation errors.    Nita Sickle, MD 08/27/17 [phone]

## 2017-10-06 ENCOUNTER — Other Ambulatory Visit: Payer: Self-pay

## 2017-10-06 ENCOUNTER — Encounter: Payer: Self-pay | Admitting: Emergency Medicine

## 2017-10-06 ENCOUNTER — Emergency Department: Payer: BLUE CROSS/BLUE SHIELD

## 2017-10-06 ENCOUNTER — Emergency Department
Admission: EM | Admit: 2017-10-06 | Discharge: 2017-10-06 | Disposition: A | Discharge status: Home / Self Care | Payer: BLUE CROSS/BLUE SHIELD | Attending: Emergency Medicine | Admitting: Emergency Medicine

## 2017-10-06 DIAGNOSIS — Z79899 Other long term (current) drug therapy: Secondary | ICD-10-CM | POA: Insufficient documentation

## 2017-10-06 DIAGNOSIS — F17290 Nicotine dependence, other tobacco product, uncomplicated: Secondary | ICD-10-CM | POA: Diagnosis not present

## 2017-10-06 DIAGNOSIS — F10129 Alcohol abuse with intoxication, unspecified: Secondary | ICD-10-CM | POA: Diagnosis not present

## 2017-10-06 DIAGNOSIS — I10 Essential (primary) hypertension: Secondary | ICD-10-CM | POA: Diagnosis not present

## 2017-10-06 DIAGNOSIS — R4182 Altered mental status, unspecified: Secondary | ICD-10-CM | POA: Diagnosis present

## 2017-10-06 LAB — COMPREHENSIVE METABOLIC PANEL
ALBUMIN: 3.7 g/dL (ref 3.5–5.0)
ALK PHOS: 56 U/L (ref 38–126)
ALT: 23 U/L (ref 0–44)
AST: 27 U/L (ref 15–41)
Anion gap: 13 (ref 5–15)
BUN: 23 mg/dL — ABNORMAL HIGH (ref 6–20)
CALCIUM: 8.2 mg/dL — AB (ref 8.9–10.3)
CO2: 24 mmol/L (ref 22–32)
CREATININE: 1.24 mg/dL (ref 0.61–1.24)
Chloride: 101 mmol/L (ref 98–111)
GFR calc Af Amer: >60 mL/min (ref >60)
GFR calc non Af Amer: >60 mL/min (ref >60)
GLUCOSE: 88 mg/dL (ref 70–99)
Potassium: 3.5 mmol/L (ref 3.5–5.1)
SODIUM: 138 mmol/L (ref 135–145)
Total Bilirubin: 0.3 mg/dL (ref 0.3–1.2)
Total Protein: 6.2 g/dL — ABNORMAL LOW (ref 6.5–8.1)

## 2017-10-06 LAB — CBC WITH DIFFERENTIAL/PLATELET
BASOS ABS: 0 10*3/uL (ref 0–0.1)
Basophils Relative: 1 %
EOS ABS: 0 10*3/uL (ref 0–0.7)
EOS PCT: 1 %
HCT: 37.6 % — ABNORMAL LOW (ref 40.0–52.0)
Hemoglobin: 13.1 g/dL (ref 13.0–18.0)
Lymphocytes Relative: 38 %
Lymphs Abs: 2.2 10*3/uL (ref 1.0–3.6)
MCH: 35 pg — ABNORMAL HIGH (ref 26.0–34.0)
MCHC: 34.8 g/dL (ref 32.0–36.0)
MCV: 100.6 fL — AB (ref 80.0–100.0)
MONO ABS: 0.5 10*3/uL (ref 0.2–1.0)
Monocytes Relative: 9 %
Neutro Abs: 3 10*3/uL (ref 1.4–6.5)
Neutrophils Relative %: 51 %
Platelets: 270 10*3/uL (ref 150–440)
RBC: 3.74 MIL/uL — AB (ref 4.40–5.90)
RDW: 15.8 % — ABNORMAL HIGH (ref 11.5–14.5)
WBC: 5.8 10*3/uL (ref 3.8–10.6)

## 2017-10-06 LAB — TROPONIN I: Troponin I: <0.03 ng/mL (ref <0.03)

## 2017-10-06 LAB — AMMONIA: Ammonia: 30 umol/L (ref 9–35)

## 2017-10-06 LAB — ETHANOL: ALCOHOL ETHYL (B): 324 mg/dL — AB (ref <10)

## 2017-10-06 NOTE — ED Notes (Signed)
Date and time results received: 10/06/17 5:18 PM  Test: Ethanol Critical Value: EtoH: 324  Name of Provider Notified: Dr. Don PerkingVeronese

## 2017-10-06 NOTE — ED Notes (Signed)
ED Provider at bedside. 

## 2017-10-06 NOTE — ED Notes (Signed)
Pt requesting to go home. MD aware.

## 2017-10-06 NOTE — ED Triage Notes (Signed)
Pt arrives via [facility] from his car in the middle of the intersection. Confused in the car, not making sense. Pt denying pain. Stuttering words.

## 2017-10-06 NOTE — ED Notes (Signed)
Pt unable to void at this time. 

## 2017-10-06 NOTE — ED Notes (Signed)
Patient transported to CT 

## 2017-10-06 NOTE — ED Provider Notes (Signed)
[facility] Emergency Department Provider Note  ____________________________________________  Time seen: Approximately 4:37 PM  I have reviewed the triage vital signs and the nursing notes.   HISTORY  Chief Complaint Altered Mental Status  Level 5 caveat:  Portions of the history and physical were unable to be obtained due to AMS   HPI Vernon Bentley is a 57 y.o. male with a history of alcohol abuse and hypertension who presents for evaluation of altered mental status.  EMS was called to the scene by police when patient was found inside his car at a traffic intersection very altered.  Patient is unable to provide any history.  He told staff that he was drinking alcohol earlier today.  He denies any other drug use.  He does not remember where he was prior to being found by EMS.  His wife reports that he went to AA meet in [place] at 1 PM and was supposed to be back at 3.  When he did not arrive home she started calling his cell phone but could not get a hold of him.  She reports that he was in rehab until Sunday and has been drinking since he left rehab.  Patient denies headache, chest pain, shortness of breath, abdominal pain, back pain.  Past Medical History:  Diagnosis Date  . Alcohol abuse   . Ear infection   . Environmental allergies   . Hypertension     Prior to Admission medications   Medication Sig Start Date End Date Taking? Authorizing Provider  losartan-hydrochlorothiazide (HYZAAR) 100-25 MG tablet Take 1 tablet by mouth daily. 09/27/17  Yes [provider]  chlordiazePOXIDE (LIBRIUM) 25 MG capsule 50 mg 3 times a day x 3 days 50 mg 2 times a day for 3 days 50 mg once a day for 3 days stop Patient not taking: Reported on 10/06/2017 08/27/17   Nita SickleVeronese, [place], MD  escitalopram (LEXAPRO) 10 MG tablet Take 10 mg by mouth daily. 07/11/17   [provider]  hydrochlorothiazide (HYDRODIURIL) 25 MG tablet Take 1 tablet (25 mg total)  by mouth daily. Patient not taking: Reported on 10/06/2017 06/05/16   Jene EveryKinner, [place], MD  Oxcarbazepine (TRILEPTAL) 300 MG tablet Take 300 mg by mouth 2 (two) times daily. 07/13/17   [provider]    Allergies Patient has no known allergies.  Family History  Problem Relation Age of Onset  . Hypertension Mother   . Hypertension Father     Social History Social History   Tobacco Use  . Smoking status: Never Smoker  . Smokeless tobacco: Current User  Substance Use Topics  . Alcohol use: Yes  . Drug use: No    Review of Systems  Constitutional: Negative for fever. + AMS Cardiovascular: Negative for chest pain. Respiratory: Negative for shortness of breath. Gastrointestinal: Negative for abdominal pain, vomiting or diarrhea. Musculoskeletal: Negative for back pain. Neurological: Negative for headaches, weakness or numbness.  ____________________________________________   PHYSICAL EXAM:  VITAL SIGNS: ED Triage Vitals  Enc Vitals Group     BP 10/06/17 1630 107/64     Pulse Rate 10/06/17 1630 83     Resp 10/06/17 1630 18     Temp 10/06/17 1630 99 F (37.2 C)     Temp Source 10/06/17 1630 Oral     SpO2 10/06/17 1630 95 %     Weight 10/06/17 1633 150 lb (68 kg)     Height 10/06/17 1633 5\' 7"  (1.702 m)  Head Circumference --      Peak Flow --      Pain Score 10/06/17 1633 0     Pain Loc --      Pain Edu? --      Excl. in GC? --     Constitutional: Alert and oriented x 3, confused, no distress.  HEENT:      Head: Normocephalic and atraumatic.         Eyes: Conjunctivae are normal. Sclera is non-icteric.       Mouth/Throat: Mucous membranes are moist.       Neck: Supple with no signs of meningismus. Cardiovascular: Regular rate and rhythm. No murmurs, gallops, or rubs. 2+ symmetrical distal pulses are present in all extremities. No JVD. Respiratory: Normal respiratory effort. Lungs are clear to auscultation bilaterally. No wheezes, crackles, or rhonchi.    Gastrointestinal: Soft, non tender, and non distended with positive bowel sounds. No rebound or guarding. Musculoskeletal: Nontender with normal range of motion in all extremities. No edema, cyanosis, or erythema of extremities. Neurologic: Confused, no slurring, difficulty finding words, no dysmetria or pronator drift, intact strength x 4, face is symmetric Skin: Skin is warm, dry and intact. No rash noted.   ____________________________________________   LABS (all labs ordered are listed, but only abnormal results are displayed)  Labs Reviewed  ETHANOL - Abnormal; Notable for the following components:      Result Value   Alcohol, Ethyl (B) 324 (*)    All other components within normal limits  CBC WITH DIFFERENTIAL/PLATELET - Abnormal; Notable for the following components:   RBC 3.74 (*)    HCT 37.6 (*)    MCV 100.6 (*)    MCH 35.0 (*)    RDW 15.8 (*)    All other components within normal limits  COMPREHENSIVE METABOLIC PANEL - Abnormal; Notable for the following components:   BUN 23 (*)    Calcium 8.2 (*)    Total Protein 6.2 (*)    All other components within normal limits  TROPONIN I  AMMONIA  URINE DRUG SCREEN, QUALITATIVE ([facility] ONLY)   ____________________________________________  EKG  ED ECG REPORT I, Nita Sickle, the attending physician, personally viewed and interpreted this ECG.  Normal sinus rhythm, rate of 82, normal intervals, normal axis, no ST elevations or depressions.  Normal EKG. ____________________________________________  RADIOLOGY  I have personally reviewed the images performed during this visit and I agree with the Radiologist's read.   Interpretation by Radiologist:  Ct Head Wo Contrast  Result Date: 10/06/2017 CLINICAL DATA:  57 year old male with confusion, dysarthria EXAM: CT HEAD WITHOUT CONTRAST TECHNIQUE: Contiguous axial images were obtained from the base of the skull through the vertex without intravenous contrast. COMPARISON:   None. FINDINGS: Brain: No evidence of acute infarction, hemorrhage, hydrocephalus, extra-axial collection or mass lesion/mass effect. Vascular: No hyperdense vessel or unexpected calcification. Skull: Normal. Negative for fracture or focal lesion. Sinuses/Orbits: No acute finding. Other: None. IMPRESSION: Negative head CT. Electronically Signed   By: Malachy Moan M.D.   On: 10/06/2017 17:07      ____________________________________________   PROCEDURES  Procedure(s) performed: None Procedures Critical Care performed:  None ____________________________________________   INITIAL IMPRESSION / ASSESSMENT AND PLAN / ED COURSE   56 y.o. male with a history of alcohol abuse and hypertension who presents for evaluation of altered mental status.  Patient is very confused with difficulty finding words, does endorse drinking a lot of alcohol today.  He is otherwise neurologically intact.  Last  seen normal was around 12 PM today.  No prior history of stroke.  Vitals are within normal limits.  Patient is completely encephalopathic.  Differential diagnoses alcohol abuse, hepatic encephalopathy (patient does not have a history of cirrhosis but is a heavy drinker for several years), stroke, subarachnoid hemorrhage.  Will get head CT, labs, drug screen, alcohol level.  Clinical Course as of Oct 07 1843  Thu Oct 06, 2017  1841 CT head with no acute findings, labs within normal limits, alcohol level of 324.  Patient mental status has improved after police officer left.  His wife is not at the bedside.  She reports that patient is at baseline and she is comfortable taking him home.  He is going to be discharged to the care of his wife.   [CV]    Clinical Course User Index [CV] Don Perking [place], MD     As part of my medical decision making, I reviewed the following data within the electronic MEDICAL RECORD NUMBER History obtained from family, Nursing notes reviewed and incorporated, Labs reviewed , EKG  interpreted , Old chart reviewed, Radiograph reviewed , Notes from prior ED visits and [place] Controlled Substance Database    Pertinent labs & imaging results that were available during my care of the patient were reviewed by me and considered in my medical decision making (see chart for details).    ____________________________________________   FINAL CLINICAL IMPRESSION(S) / ED DIAGNOSES  Final diagnoses:  Alcohol abuse with intoxication (HCC)      NEW MEDICATIONS STARTED DURING THIS VISIT:  ED Discharge Orders    None       Note:  This document was prepared using Dragon voice recognition software and may include unintentional dictation errors.    Don Perking, [place], MD 10/06/17 [phone]

## 2017-10-06 NOTE — ED Notes (Signed)
Patient in lobby requesting that IV be discontinued.  Patient with family member.  IV discontinued and encouraged patient to return to room, which he did.

## 2017-10-06 NOTE — ED Notes (Addendum)
Officers at bedside requesting consent from patient for forensic blood draw. Patient informed by officer that blood will be tested for blood alcohol content and if it is shown that level is above 0.08, he will be considered impaired. Patient verbalized understanding of this and that it can be used in legal proceedings. This RN present. Consent given by patient.   Two lab tubes collected by this RN and [facility] at bedside.

## 2017-12-09 ENCOUNTER — Other Ambulatory Visit: Payer: Self-pay

## 2017-12-09 ENCOUNTER — Emergency Department
Admission: EM | Admit: 2017-12-09 | Discharge: 2017-12-10 | Disposition: A | Discharge status: Home / Self Care | Payer: BLUE CROSS/BLUE SHIELD | Attending: Emergency Medicine | Admitting: Emergency Medicine

## 2017-12-09 DIAGNOSIS — Z79899 Other long term (current) drug therapy: Secondary | ICD-10-CM | POA: Insufficient documentation

## 2017-12-09 DIAGNOSIS — F10920 Alcohol use, unspecified with intoxication, uncomplicated: Secondary | ICD-10-CM

## 2017-12-09 DIAGNOSIS — F10120 Alcohol abuse with intoxication, uncomplicated: Secondary | ICD-10-CM | POA: Insufficient documentation

## 2017-12-09 DIAGNOSIS — I10 Essential (primary) hypertension: Secondary | ICD-10-CM | POA: Insufficient documentation

## 2017-12-09 DIAGNOSIS — F17290 Nicotine dependence, other tobacco product, uncomplicated: Secondary | ICD-10-CM | POA: Diagnosis not present

## 2017-12-09 DIAGNOSIS — R55 Syncope and collapse: Secondary | ICD-10-CM | POA: Diagnosis present

## 2017-12-09 DIAGNOSIS — F1010 Alcohol abuse, uncomplicated: Secondary | ICD-10-CM

## 2017-12-09 DIAGNOSIS — F331 Major depressive disorder, recurrent, moderate: Secondary | ICD-10-CM

## 2017-12-09 DIAGNOSIS — R52 Pain, unspecified: Secondary | ICD-10-CM | POA: Insufficient documentation

## 2017-12-09 MED ORDER — LORAZEPAM 2 MG/ML IJ SOLN
INTRAMUSCULAR | Status: AC
  Administered 2017-12-10: 1 mg via INTRAVENOUS
  Filled 2017-12-09: qty 1

## 2017-12-09 NOTE — ED Triage Notes (Signed)
Patient to ED via EMS after having been [place] EMS reports wife went to movies and when she returned he was not there. She went looking for him and found him intoxicated/passed out by the tennis courts. Patient is able to answer questions but slow to respond.

## 2017-12-10 ENCOUNTER — Emergency Department: Payer: BLUE CROSS/BLUE SHIELD

## 2017-12-10 LAB — CBC WITH DIFFERENTIAL/PLATELET
Basophils Absolute: 0.1 10*3/uL (ref 0–0.1)
Basophils Relative: 1 %
EOS PCT: 2 %
Eosinophils Absolute: 0.2 10*3/uL (ref 0–0.7)
HCT: 40.8 % (ref 40.0–52.0)
Hemoglobin: 14.2 g/dL (ref 13.0–18.0)
LYMPHS PCT: 47 %
Lymphs Abs: 3.8 10*3/uL — ABNORMAL HIGH (ref 1.0–3.6)
MCH: 35.1 pg — AB (ref 26.0–34.0)
MCHC: 34.9 g/dL (ref 32.0–36.0)
MCV: 100.5 fL — AB (ref 80.0–100.0)
MONO ABS: 0.6 10*3/uL (ref 0.2–1.0)
Monocytes Relative: 7 %
Neutro Abs: 3.6 10*3/uL (ref 1.4–6.5)
Neutrophils Relative %: 43 %
PLATELETS: 205 10*3/uL (ref 150–440)
RBC: 4.06 MIL/uL — ABNORMAL LOW (ref 4.40–5.90)
RDW: 13.5 % (ref 11.5–14.5)
WBC: 8.3 10*3/uL (ref 3.8–10.6)

## 2017-12-10 LAB — COMPREHENSIVE METABOLIC PANEL
ALT: 220 U/L — ABNORMAL HIGH (ref 0–44)
ANION GAP: 9 (ref 5–15)
AST: 295 U/L — AB (ref 15–41)
Albumin: 4.1 g/dL (ref 3.5–5.0)
Alkaline Phosphatase: 67 U/L (ref 38–126)
BUN: 16 mg/dL (ref 6–20)
CHLORIDE: 102 mmol/L (ref 98–111)
CO2: 30 mmol/L (ref 22–32)
Calcium: 8.8 mg/dL — ABNORMAL LOW (ref 8.9–10.3)
Creatinine, Ser: 1.28 mg/dL — ABNORMAL HIGH (ref 0.61–1.24)
GLUCOSE: 100 mg/dL — AB (ref 70–99)
POTASSIUM: 4.4 mmol/L (ref 3.5–5.1)
Sodium: 141 mmol/L (ref 135–145)
TOTAL PROTEIN: 6.8 g/dL (ref 6.5–8.1)
Total Bilirubin: 0.6 mg/dL (ref 0.3–1.2)

## 2017-12-10 LAB — ETHANOL: ALCOHOL ETHYL (B): 382 mg/dL — AB (ref <10)

## 2017-12-10 LAB — LIPASE, BLOOD: Lipase: 29 U/L (ref 11–51)

## 2017-12-10 MED ORDER — DISULFIRAM 250 MG PO TABS: 250.0000 mg | ORAL_TABLET | Freq: Every day | ORAL | 0 refills | Status: AC

## 2017-12-10 MED ORDER — LORAZEPAM 2 MG/ML IJ SOLN
0.0000 mg | Freq: Four times a day (QID) | INTRAMUSCULAR | Status: DC
  Administered 2017-12-10: 2 mg via INTRAVENOUS
  Filled 2017-12-10: qty 1

## 2017-12-10 MED ORDER — LORAZEPAM 2 MG/ML IJ SOLN
1.0000 mg | Freq: Once | INTRAMUSCULAR | Status: AC
  Administered 2017-12-10: 1 mg via INTRAVENOUS

## 2017-12-10 MED ORDER — THIAMINE HCL 100 MG/ML IJ SOLN
100.0000 mg | Freq: Every day | INTRAMUSCULAR | Status: DC
  Administered 2017-12-10: 100 mg via INTRAVENOUS
  Filled 2017-12-10: qty 2

## 2017-12-10 MED ORDER — SODIUM CHLORIDE 0.9 % IV SOLN
Freq: Once | INTRAVENOUS | Status: AC
  Administered 2017-12-10: 03:00:00 via INTRAVENOUS

## 2017-12-10 MED ORDER — SODIUM CHLORIDE 0.9 % IV BOLUS
1000.0000 mL | Freq: Once | INTRAVENOUS | Status: AC
  Administered 2017-12-10: 1000 mL via INTRAVENOUS

## 2017-12-10 MED ORDER — THIAMINE HCL 100 MG/ML IJ SOLN
Freq: Once | INTRAVENOUS | Status: AC
  Administered 2017-12-10: 01:00:00 via INTRAVENOUS
  Filled 2017-12-10: qty 1000

## 2017-12-10 MED ORDER — HALOPERIDOL LACTATE 5 MG/ML IJ SOLN
5.0000 mg | Freq: Once | INTRAMUSCULAR | Status: AC
  Administered 2017-12-10: 5 mg via INTRAVENOUS

## 2017-12-10 MED ORDER — HALOPERIDOL LACTATE 5 MG/ML IJ SOLN
INTRAMUSCULAR | Status: AC
  Filled 2017-12-10: qty 1

## 2017-12-10 NOTE — ED Notes (Signed)
Patient blood pressure 77/42. Oxygen saturation down to 74% on room air. Patient placed on non rebreather. Oxygen saturation up to 94%. Dr. Dolores FrameSung notified. New order received for NS bolus.

## 2017-12-10 NOTE — ED Notes (Signed)
[id number]

## 2017-12-10 NOTE — ED Notes (Signed)
Remains  Sleeping  At this time  Sitter at bedside

## 2017-12-10 NOTE — ED Notes (Signed)
Patient transported to CT 

## 2017-12-10 NOTE — ED Provider Notes (Signed)
Patient finally wakes up and stays awake.  He is seen by tele-psychiatry who recommends AA detox at home and Antabuse from primary care.  He will not be out of see his primary care doctor till Monday at the earliest so I will give him the Antabuse I discussed this with his wife.  We will watch him as well.  I will now discharge the patient as he is again staying awake by himself without stimulation and breathing normally.   Arnaldo NatalMalinda, Larna Capelle F, MD 12/10/17 1409

## 2017-12-10 NOTE — ED Notes (Signed)
Patient  Verbally woken up   -  He responded   Slowly  However is responsive to time  Current events and  He knows he is in the hospital  His hand grips are  Mod    He speech is intact   He stood up with assistance and voided 600  CC . He was given ginger ale and crackers . Swallowing is intact  - sitter remains at bedside

## 2017-12-10 NOTE — BH Assessment (Signed)
This writer attempted to complete TTS assessment, but patient was sedated and unable to participate. Writer will attempt assessment again in a few hours.

## 2017-12-10 NOTE — ED Provider Notes (Signed)
[facility] Emergency Department Provider Note   ____________________________________________   First MD Initiated Contact with Patient 12/10/17 0004     (approximate)  I have reviewed the triage vital signs and the nursing notes.   HISTORY  Chief Complaint Alcohol Intoxication  Level V caveat: Limited by intoxication  HPI Vernon Bentley is a 57 y.o. male brought to the ED from home via EMS with a chief complaint of alcohol intoxication.  EMS reports wife went to the movies and could not find the patient when she returned home.  She eventually found him passed out by the [place] in [place].  Patient complains of "pain all over".  Does not recall events leading up to him being at the tennis court.  Admits to being an alcoholic with a prior history of DTs.  Rest of history is unobtainable secondary to patient's degree of intoxication.   Past Medical History:  Diagnosis Date  . Alcohol abuse   . Ear infection   . Environmental allergies   . Hypertension     There are no active problems to display for this patient.   No past surgical history on file.  Prior to Admission medications   Medication Sig Start Date End Date Taking? Authorizing Provider  chlordiazePOXIDE (LIBRIUM) 25 MG capsule 50 mg 3 times a day x 3 days 50 mg 2 times a day for 3 days 50 mg once a day for 3 days stop Patient not taking: Reported on 10/06/2017 08/27/17   Nita SickleVeronese, Liberty, MD  escitalopram (LEXAPRO) 10 MG tablet Take 10 mg by mouth daily. 07/11/17   [provider]  hydrochlorothiazide (HYDRODIURIL) 25 MG tablet Take 1 tablet (25 mg total) by mouth daily. Patient not taking: Reported on 10/06/2017 06/05/16   Jene EveryKinner, Robert, MD  losartan-hydrochlorothiazide (HYZAAR) 100-25 MG tablet Take 1 tablet by mouth daily. 09/27/17   [provider]  Oxcarbazepine (TRILEPTAL) 300 MG tablet Take 300 mg by mouth 2 (two) times daily. 07/13/17   [provider]    Allergies Patient has no known allergies.  Family History  Problem Relation Age of Onset  . Hypertension Mother   . Hypertension Father     Social History Social History   Tobacco Use  . Smoking status: Never Smoker  . Smokeless tobacco: Current User  Substance Use Topics  . Alcohol use: Yes  . Drug use: No    Review of Systems  Constitutional: No fever/chills Eyes: No visual changes. ENT: No sore throat. Cardiovascular: Denies chest pain. Respiratory: Denies shortness of breath. Gastrointestinal: No abdominal pain.  No nausea, no vomiting.  No diarrhea.  No constipation. Genitourinary: Negative for dysuria. Musculoskeletal: Negative for back pain. Skin: Negative for rash. Neurological: Negative for headaches, focal weakness or numbness. Psychiatric:Intoxicated.   ____________________________________________   PHYSICAL EXAM:  VITAL SIGNS: ED Triage Vitals  Enc Vitals Group     BP 12/09/17 2354 (!) 111/54     Pulse Rate 12/09/17 2354 75     Resp 12/09/17 2354 18     Temp 12/09/17 2354 97.8 F (36.6 C)     Temp Source 12/09/17 2354 Oral     SpO2 12/09/17 2354 93 %     Weight 12/09/17 2356 160 lb (72.6 kg)     Height 12/09/17 2356 5\' 5"  (1.651 m)     Head Circumference --      Peak Flow --      Pain Score 12/09/17 2355 0  Pain Loc --      Pain Edu? --      Excl. in GC? --     Constitutional: Alert.  Intoxicated appearing and in mild acute distress. Eyes: Conjunctivae are normal. PERRL. EOMI. Head: Atraumatic. Nose: No external evidence of injury. Mouth/Throat: Mucous membranes are moist.  No dental malocclusion. Neck: No stridor.  No cervical spine tenderness to palpation. Cardiovascular: Normal rate, regular rhythm. Grossly normal heart sounds.  Good peripheral circulation. Respiratory: Normal respiratory effort.  No retractions. Lungs CTAB. Gastrointestinal: Soft and nontender. No distention. No abdominal bruits. No CVA  tenderness. Musculoskeletal: No lower extremity tenderness nor edema.  No joint effusions. Neurologic: Slurred speech and language. No gross focal neurologic deficits are appreciated.  Skin:  Skin is warm, dry and intact. No rash noted. Psychiatric: Heavily intoxicated. ____________________________________________   LABS (all labs ordered are listed, but only abnormal results are displayed)  Labs Reviewed  CBC WITH DIFFERENTIAL/PLATELET - Abnormal; Notable for the following components:      Result Value   RBC 4.06 (*)    MCV 100.5 (*)    MCH 35.1 (*)    Lymphs Abs 3.8 (*)    All other components within normal limits  COMPREHENSIVE METABOLIC PANEL - Abnormal; Notable for the following components:   Glucose, Bld 100 (*)    Creatinine, Ser 1.28 (*)    Calcium 8.8 (*)    AST 295 (*)    ALT 220 (*)    All other components within normal limits  ETHANOL - Abnormal; Notable for the following components:   Alcohol, Ethyl (B) 382 (*)    All other components within normal limits  LIPASE, BLOOD   ____________________________________________  EKG  ED ECG REPORT I, Nadia Torr J, the attending physician, personally viewed and interpreted this ECG.   Date: 12/10/2017  EKG Time: 0230  Rate: 86  Rhythm: normal EKG, normal sinus rhythm  Axis: Normal  Intervals:QTC 515  ST&T Change: Nonspecific  ____________________________________________  RADIOLOGY  ED MD interpretation: No ICH; pulmonary vascular congestion  Official radiology report(s): Ct Head Wo Contrast  Result Date: 12/10/2017 CLINICAL DATA:  Patient found passed out. EXAM: CT HEAD WITHOUT CONTRAST TECHNIQUE: Contiguous axial images were obtained from the base of the skull through the vertex without intravenous contrast. COMPARISON:  Brain CT 10/06/2017 FINDINGS: Brain: Ventricles and sulci are appropriate for patient's age. No evidence for acute cortically based infarct, intracranial hemorrhage, mass lesion or mass-effect.  Vascular: Internal carotid arterial vascular calcifications. Skull: Intact.  Mastoid air cells Sinuses/Orbits: Paranasal sinuses are well aerated are unremarkable. Orbits are unremarkable. Other: None. IMPRESSION: No acute intracranial process. Electronically Signed   By: Annia Belt M.D.   On: 12/10/2017 00:52   Dg Chest Port 1 View  Result Date: 12/10/2017 CLINICAL DATA:  Hypoxia. History of alcohol abuse. Assess for aspiration. EXAM: PORTABLE CHEST 1 VIEW COMPARISON:  None. FINDINGS: Cardiomediastinal silhouette is unremarkable for this low inspiratory examination with crowded vasculature markings. Mild interstitial prominence and pulmonary vascular congestion. Bibasilar bandlike densities. Trachea projects midline and there is no pneumothorax. Included soft tissue planes and osseous structures are non-suspicious. IMPRESSION: Interstitial prominence suggesting pulmonary edema, less likely atypical infection. Bibasilar atelectasis/scarring. Electronically Signed   By: Awilda Metro M.D.   On: 12/10/2017 04:47    ____________________________________________   PROCEDURES  Procedure(s) performed: None  Procedures  Critical Care performed: Yes, see critical care note(s)   CRITICAL CARE Performed by: Irean Hong   Total critical care time: 45 minutes  Critical care time was exclusive of separately billable procedures and treating other patients.  Critical care was necessary to treat or prevent imminent or life-threatening deterioration.  Critical care was time spent personally by me on the following activities: development of treatment plan with patient and/or surrogate as well as nursing, discussions with consultants, evaluation of patient's response to treatment, examination of patient, obtaining history from patient or surrogate, ordering and performing treatments and interventions, ordering and review of laboratory studies, ordering and review of radiographic studies, pulse oximetry  and re-evaluation of patient's condition.  ____________________________________________   INITIAL IMPRESSION / ASSESSMENT AND PLAN / ED COURSE  As part of my medical decision making, I reviewed the following data within the electronic MEDICAL RECORD NUMBER History obtained from family, Nursing notes reviewed and incorporated, Labs reviewed, Old chart reviewed, Radiograph reviewed and Notes from prior ED visits   57 year old male with history of alcohol abuse who presents for intoxication; found passed out. Differential diagnosis includes but is not limited to intoxication, ICH, CVA, CAD, metabolic etiology, etc.  Will initiate IV fluid resuscitation, obtain basic lab work, CT head to evaluate for intercranial hemorrhage and reassess.  Clinical Course as of Dec 10 733  Sat Dec 10, 2017  1610 Now at bedside.  Updated her of all test results.  Patient appears to be agitated despite Ativan administration.  He is trying to get out of the bed.  Will administer 5 mg IV Haldol for calming effect.  Wife tells me that patient was recently kicked out of a rehab facility.  States patient is depressed all the time and today expressed vague SI without plan.  For this reason, will place safety sitter at bedside and consult [facility] psychiatry.   [JS]  0240 Haldol was given with very good calming effect.  Patient now sleeping soundly.  Nonrebreather applied for hypoxia from sedative.  Third liter IV fluids infusing for hypotension.   [JS]  [id number] Patient remains soundly asleep.  Third liter IV fluids completed.  Blood pressure currently 99/56.  Will hold additional fluids for now and continue to monitor.  Portable chest x-ray was ordered to evaluate possible aspiration since patient was found unresponsive.   [JS]  0656 No further events.  Patient remains asleep.  Once patient is awake, he will have [facility] psychiatry evaluation.  At this time care is transferred to Dr. Darnelle Catalan.   [JS]    Clinical Course User Index [JS]  Irean Hong, MD     ____________________________________________   FINAL CLINICAL IMPRESSION(S) / ED DIAGNOSES  Final diagnoses:  ETOH abuse  Acute alcoholic intoxication without complication (HCC)  Moderate episode of recurrent major depressive disorder [facility])     ED Discharge Orders    None       Note:  This document was prepared using Dragon voice recognition software and may include unintentional dictation errors.    Irean Hong, MD 12/10/17 [phone]

## 2017-12-10 NOTE — ED Notes (Signed)
Pt  Woken up   Oriented  Time    And president

## 2017-12-10 NOTE — ED Notes (Addendum)
Video conference with patient and wife with Psychiatrist Dr Maricela BoSprague

## 2017-12-10 NOTE — ED Notes (Signed)
SOC machine placed in room at this time. Wife at bedside, sitter at bedside.

## 2017-12-10 NOTE — Discharge Instructions (Addendum)
Please follow-up with Alcoholics Anonymous.  You can go to meetings several times a day if need be.  If you begin getting signs of withdrawal start the clonazepam as directed.  That would be 2 pills every 8 hours for 1 day then 1 pill every 8 hours for another day and a half or to said another way to pills every 8 hours for 3 doses and then 1 pill every 8 hours for 6 doses.  I wrote you a prescription for Antabuse as well.  2 pills a day for 2 weeks then 1 pill a day.  Be careful not to take the Antabuse if you have had alcohol within the last 12 hours.  He can make you violently ill.  Return here for any problems.

## 2017-12-10 NOTE — ED Notes (Signed)
Pt sleeping -  Airway intact -  Side rails elevated    Remains on monitor   100 per cent rebreather   Sitter at bedside

## 2017-12-10 NOTE — ED Notes (Signed)
Diet taken well

## 2017-12-10 NOTE — ED Notes (Signed)
Date and time results received: 12/10/17 0004 (use smartphrase ".now" to insert current time)  Test: etoh Critical Value: 382  Name of Provider Notified: Dr. Dolores FrameSung  Orders Received? Or Actions Taken?: acknowledged

## 2018-09-06 ENCOUNTER — Other Ambulatory Visit: Payer: Self-pay

## 2018-09-06 ENCOUNTER — Emergency Department
Admission: EM | Admit: 2018-09-06 | Discharge: 2018-09-07 | Disposition: A | Discharge status: Home / Self Care | Payer: BLUE CROSS/BLUE SHIELD | Attending: Emergency Medicine | Admitting: Emergency Medicine

## 2018-09-06 ENCOUNTER — Encounter: Payer: Self-pay | Admitting: Emergency Medicine

## 2018-09-06 DIAGNOSIS — F10920 Alcohol use, unspecified with intoxication, uncomplicated: Secondary | ICD-10-CM | POA: Insufficient documentation

## 2018-09-06 DIAGNOSIS — Z79899 Other long term (current) drug therapy: Secondary | ICD-10-CM | POA: Diagnosis not present

## 2018-09-06 DIAGNOSIS — F17228 Nicotine dependence, chewing tobacco, with other nicotine-induced disorders: Secondary | ICD-10-CM | POA: Diagnosis not present

## 2018-09-06 DIAGNOSIS — I10 Essential (primary) hypertension: Secondary | ICD-10-CM | POA: Insufficient documentation

## 2018-09-06 DIAGNOSIS — R4182 Altered mental status, unspecified: Secondary | ICD-10-CM | POA: Diagnosis present

## 2018-09-06 LAB — CBC
HCT: 44.3 % (ref 39.0–52.0)
Hemoglobin: 15.1 g/dL (ref 13.0–17.0)
MCH: 34.1 pg — ABNORMAL HIGH (ref 26.0–34.0)
MCHC: 34.1 g/dL (ref 30.0–36.0)
MCV: 100 fL (ref 80.0–100.0)
Platelets: 229 10*3/uL (ref 150–400)
RBC: 4.43 MIL/uL (ref 4.22–5.81)
RDW: 14.6 % (ref 11.5–15.5)
WBC: 7 10*3/uL (ref 4.0–10.5)
nRBC: 0 % (ref 0.0–0.2)

## 2018-09-06 LAB — COMPREHENSIVE METABOLIC PANEL
ALT: 57 U/L — ABNORMAL HIGH (ref 0–44)
AST: 91 U/L — ABNORMAL HIGH (ref 15–41)
Albumin: 4.3 g/dL (ref 3.5–5.0)
Alkaline Phosphatase: 90 U/L (ref 38–126)
Anion gap: 12 (ref 5–15)
BUN: 12 mg/dL (ref 6–20)
CO2: 26 mmol/L (ref 22–32)
Calcium: 8.9 mg/dL (ref 8.9–10.3)
Chloride: 104 mmol/L (ref 98–111)
Creatinine, Ser: 0.81 mg/dL (ref 0.61–1.24)
GFR calc Af Amer: >60 mL/min (ref >60)
GFR calc non Af Amer: >60 mL/min (ref >60)
Glucose, Bld: 99 mg/dL (ref 70–99)
Potassium: 3.9 mmol/L (ref 3.5–5.1)
Sodium: 142 mmol/L (ref 135–145)
Total Bilirubin: 0.5 mg/dL (ref 0.3–1.2)
Total Protein: 7.8 g/dL (ref 6.5–8.1)

## 2018-09-06 LAB — URINE DRUG SCREEN, QUALITATIVE (ARMC ONLY)
Amphetamines, Ur Screen: NOT DETECTED
Barbiturates, Ur Screen: NOT DETECTED
Benzodiazepine, Ur Scrn: NOT DETECTED
Cannabinoid 50 Ng, Ur ~~LOC~~: NOT DETECTED
Cocaine Metabolite,Ur ~~LOC~~: NOT DETECTED
MDMA (Ecstasy)Ur Screen: NOT DETECTED
Methadone Scn, Ur: NOT DETECTED
Opiate, Ur Screen: NOT DETECTED
Phencyclidine (PCP) Ur S: NOT DETECTED
Tricyclic, Ur Screen: NOT DETECTED

## 2018-09-06 LAB — ETHANOL: Alcohol, Ethyl (B): 416 mg/dL (ref <10)

## 2018-09-06 NOTE — ED Provider Notes (Signed)
[facility] Emergency Department Provider Note   ____________________________________________    I have reviewed the triage vital signs and the nursing notes.   HISTORY  Chief Complaint Altered mental status    HPI Vernon Bentley is a 58 y.o. male who presents with altered mental status almost certainly related to alcohol intoxication.  [place] reportedly drinking nearly a liter of liquor.  Patient apparently has a history of alcohol abuse, nurse as discussed with patient's wife who reports this is not uncommon for him in the past he has been so drunk that he has needed sedation in the emergency department.  Patient states he feels fine, he notes that he did not suffer any injuries  Past Medical History:  Diagnosis Date  . Alcohol abuse   . Ear infection   . Environmental allergies   . Hypertension     There are no active problems to display for this patient.   History reviewed. No pertinent surgical history.  Prior to Admission medications   Medication Sig Start Date End Date Taking? Authorizing Provider  busPIRone (BUSPAR) 15 MG tablet Take 15 mg by mouth 3 (three) times daily. 11/08/17   [provider]  chlordiazePOXIDE (LIBRIUM) 25 MG capsule 50 mg 3 times a day x 3 days 50 mg 2 times a day for 3 days 50 mg once a day for 3 days stop Patient not taking: Reported on 10/06/2017 08/27/17   Nita SickleVeronese, [place], MD  [employer] MELATONIN 10 MG CAPS Take 10 mg by mouth at bedtime. 11/08/17   [provider]  disulfiram (ANTABUSE) 250 MG tablet Take 1 tablet (250 mg total) by mouth daily. Start taking 2 tablets a day for 2 weeks then taper down to 1 tablet a day.  Make sure you have not had any alcohol for at least 12 hours before starting this medication.  Do not drink on this medication as you can become violently ill on it. 12/10/17   Arnaldo NatalMalinda, Paul F, MD  escitalopram (LEXAPRO) 20 MG tablet  Take 20 mg by mouth daily. 11/08/17   [provider]  hydrochlorothiazide (HYDRODIURIL) 25 MG tablet Take 1 tablet (25 mg total) by mouth daily. Patient not taking: Reported on 10/06/2017 06/05/16   Jene EveryKinner, Mayda [place], MD  losartan-hydrochlorothiazide (HYZAAR) 100-25 MG tablet Take 1 tablet by mouth daily. 09/27/17   [provider]  naltrexone (DEPADE) 50 MG tablet Take 50 mg by mouth daily. 11/08/17   [provider]  OXcarbazepine (TRILEPTAL) 150 MG tablet Take 150 mg by mouth 2 (two) times daily. 11/08/17   [provider]     Allergies Patient has no known allergies.  Family History  Problem Relation Age of Onset  . Hypertension Mother   . Hypertension Father     Social History Social History   Tobacco Use  . Smoking status: Never Smoker  . Smokeless tobacco: Current User  Substance Use Topics  . Alcohol use: Yes  . Drug use: No    Review of Systems  Constitutional: No weakness Eyes: No visual changes.  ENT: No neck pain Cardiovascular: Denies chest pain. Respiratory: Denies shortness of breath. Gastrointestinal: No abdominal pain.   Genitourinary: No groin injury Musculoskeletal: Negative for back pain. Skin: Negative for laceration Neurological: Negative for headaches    ____________________________________________   PHYSICAL EXAM:  VITAL SIGNS: ED Triage Vitals  Enc Vitals Group     BP 09/06/18 2146 (!) 138/95  Pulse Rate 09/06/18 2146 79     Resp 09/06/18 2146 (!) 25     Temp 09/06/18 2146 98.6 F (37 C)     Temp Source 09/06/18 2146 Oral     SpO2 09/06/18 2146 95 %     Weight 09/06/18 2147 68 kg (150 lb)     Height 09/06/18 2147 1.702 m (5\' 7" )     Head Circumference --      Peak Flow --      Pain Score 09/06/18 2146 0     Pain Loc --      Pain Edu? --      Excl. in GC? --     Constitutional: Alert  Eyes: Conjunctivae are normal.  Head: Atraumatic. Nose: No congestion/rhinnorhea. Mouth/Throat: Mucous  membranes are moist.   Neck:  Painless ROM, no vertebral tenderness to palpation Cardiovascular: Normal rate, regular rhythm. Grossly normal heart sounds.  Good peripheral circulation. Respiratory: Normal respiratory effort.  No retractions. Lungs CTAB. Gastrointestinal: Soft and nontender. No distention.  No CVA tenderness.  Musculoskeletal:  Warm and well perfused Neurologic:  Normal speech and language. No gross focal neurologic deficits are appreciated.  Skin:  Skin is warm, dry and intact. No rash noted.   ____________________________________________   LABS (all labs ordered are listed, but only abnormal results are displayed)  Labs Reviewed  CBC  COMPREHENSIVE METABOLIC PANEL  ETHANOL  URINE DRUG SCREEN, QUALITATIVE ([facility] ONLY)   ____________________________________________  EKG  ED ECG REPORT I, Jene Every, the attending physician, personally viewed and interpreted this ECG.  Date: 09/06/2018  Rhythm: normal sinus rhythm QRS Axis: normal Intervals: normal ST/T Wave abnormalities: normal Narrative Interpretation: no evidence of acute ischemia  ____________________________________________  RADIOLOGY  None ____________________________________________   PROCEDURES  Procedure(s) performed: No  Procedures   Critical Care performed: No ____________________________________________   INITIAL IMPRESSION / ASSESSMENT AND PLAN / ED COURSE  Pertinent labs & imaging results that were available during my care of the patient were reviewed by me and considered in my medical decision making (see chart for details).  Patient presents with alcohol intoxication, he denies additional drug use.  Vital signs are overall reassuring, no evidence of traumatic injury on thorough exam.  We will check labs, observe in the emergency department.    ____________________________________________   FINAL CLINICAL IMPRESSION(S) / ED DIAGNOSES  Final diagnoses:  Alcoholic  intoxication without complication (HCC)        Note:  This document was prepared using Dragon voice recognition software and may include unintentional dictation errors.   Jene Every, MD 09/06/18 2217

## 2018-09-06 NOTE — ED Notes (Signed)
Date and time results received: 09/06/18 2308  Test: ETOH Critical Value: 416  Name of Provider Notified: Dr. Manson Passey

## 2018-09-06 NOTE — ED Triage Notes (Addendum)
Patient coming in [facility] for ETOH. Patient was found in [place] this evening and patient report he drank a pint of malt liquor. Patient vss. Patient states he lives with his wife. Patient states he did fall and does not know if he hit his head or not.

## 2018-09-06 NOTE — ED Notes (Signed)
Patient requested this RN to call wife, Roanna Raider to update her

## 2018-09-06 NOTE — ED Notes (Signed)
Patient spoke with wife on phone and let her know where he was at.

## 2018-09-07 NOTE — ED Notes (Signed)
Pt's wife here to pick pt up ([phone])--[facility] nurse Tom notified

## 2018-10-30 ENCOUNTER — Emergency Department
Admission: EM | Admit: 2018-10-30 | Discharge: 2018-10-31 | Disposition: A | Discharge status: Home / Self Care | Payer: BC Managed Care – PPO | Attending: Emergency Medicine | Admitting: Emergency Medicine

## 2018-10-30 ENCOUNTER — Emergency Department: Payer: BC Managed Care – PPO

## 2018-10-30 DIAGNOSIS — F10129 Alcohol abuse with intoxication, unspecified: Secondary | ICD-10-CM | POA: Diagnosis not present

## 2018-10-30 DIAGNOSIS — W0110XA Fall on same level from slipping, tripping and stumbling with subsequent striking against unspecified object, initial encounter: Secondary | ICD-10-CM | POA: Diagnosis not present

## 2018-10-30 DIAGNOSIS — S0101XA Laceration without foreign body of scalp, initial encounter: Secondary | ICD-10-CM

## 2018-10-30 DIAGNOSIS — Z79899 Other long term (current) drug therapy: Secondary | ICD-10-CM | POA: Insufficient documentation

## 2018-10-30 DIAGNOSIS — I10 Essential (primary) hypertension: Secondary | ICD-10-CM | POA: Diagnosis not present

## 2018-10-30 DIAGNOSIS — Y998 Other external cause status: Secondary | ICD-10-CM | POA: Insufficient documentation

## 2018-10-30 DIAGNOSIS — S0990XA Unspecified injury of head, initial encounter: Secondary | ICD-10-CM | POA: Diagnosis present

## 2018-10-30 DIAGNOSIS — Y929 Unspecified place or not applicable: Secondary | ICD-10-CM | POA: Diagnosis not present

## 2018-10-30 DIAGNOSIS — Y9389 Activity, other specified: Secondary | ICD-10-CM | POA: Insufficient documentation

## 2018-10-30 DIAGNOSIS — W19XXXA Unspecified fall, initial encounter: Secondary | ICD-10-CM

## 2018-10-30 DIAGNOSIS — F10929 Alcohol use, unspecified with intoxication, unspecified: Secondary | ICD-10-CM

## 2018-10-30 LAB — PROTIME-INR
INR: 0.9 (ref 0.8–1.2)
Prothrombin Time: 12.4 seconds (ref 11.4–15.2)

## 2018-10-30 LAB — COMPREHENSIVE METABOLIC PANEL
ALT: 17 U/L (ref 0–44)
AST: 22 U/L (ref 15–41)
Albumin: 4.3 g/dL (ref 3.5–5.0)
Alkaline Phosphatase: 78 U/L (ref 38–126)
Anion gap: 12 (ref 5–15)
BUN: 14 mg/dL (ref 6–20)
CO2: 22 mmol/L (ref 22–32)
Calcium: 8.6 mg/dL — ABNORMAL LOW (ref 8.9–10.3)
Chloride: 105 mmol/L (ref 98–111)
Creatinine, Ser: 1.07 mg/dL (ref 0.61–1.24)
GFR calc Af Amer: >60 mL/min (ref >60)
GFR calc non Af Amer: >60 mL/min (ref >60)
Glucose, Bld: 99 mg/dL (ref 70–99)
Potassium: 3.8 mmol/L (ref 3.5–5.1)
Sodium: 139 mmol/L (ref 135–145)
Total Bilirubin: 0.7 mg/dL (ref 0.3–1.2)
Total Protein: 7 g/dL (ref 6.5–8.1)

## 2018-10-30 LAB — CBC WITH DIFFERENTIAL/PLATELET
Abs Immature Granulocytes: 0.01 10*3/uL (ref 0.00–0.07)
Basophils Absolute: 0.1 10*3/uL (ref 0.0–0.1)
Basophils Relative: 1 %
Eosinophils Absolute: 0.2 10*3/uL (ref 0.0–0.5)
Eosinophils Relative: 3 %
HCT: 42.9 % (ref 39.0–52.0)
Hemoglobin: 14.5 g/dL (ref 13.0–17.0)
Immature Granulocytes: 0 %
Lymphocytes Relative: 56 %
Lymphs Abs: 5.1 10*3/uL — ABNORMAL HIGH (ref 0.7–4.0)
MCH: 33.3 pg (ref 26.0–34.0)
MCHC: 33.8 g/dL (ref 30.0–36.0)
MCV: 98.6 fL (ref 80.0–100.0)
Monocytes Absolute: 0.7 10*3/uL (ref 0.1–1.0)
Monocytes Relative: 8 %
Neutro Abs: 2.9 10*3/uL (ref 1.7–7.7)
Neutrophils Relative %: 32 %
Platelets: 331 10*3/uL (ref 150–400)
RBC: 4.35 MIL/uL (ref 4.22–5.81)
RDW: 13.3 % (ref 11.5–15.5)
WBC: 9.1 10*3/uL (ref 4.0–10.5)
nRBC: 0 % (ref 0.0–0.2)

## 2018-10-30 LAB — ETHANOL: Alcohol, Ethyl (B): 416 mg/dL (ref <10)

## 2018-10-30 MED ORDER — LORAZEPAM 2 MG/ML IJ SOLN
2.0000 mg | Freq: Once | INTRAMUSCULAR | Status: AC
  Administered 2018-10-30: 2 mg via INTRAVENOUS

## 2018-10-30 MED ORDER — THIAMINE HCL 100 MG/ML IJ SOLN
100.0000 mg | Freq: Once | INTRAMUSCULAR | Status: AC
  Administered 2018-10-30: 100 mg via INTRAVENOUS
  Filled 2018-10-30: qty 2

## 2018-10-30 MED ORDER — LORAZEPAM 2 MG/ML IJ SOLN
1.0000 mg | Freq: Once | INTRAMUSCULAR | Status: DC
  Filled 2018-10-30: qty 1

## 2018-10-30 MED ORDER — SODIUM CHLORIDE 0.9 % IV BOLUS
500.0000 mL | Freq: Once | INTRAVENOUS | Status: AC
  Administered 2018-10-30: 500 mL via INTRAVENOUS

## 2018-10-30 NOTE — ED Triage Notes (Signed)
ETOH s/p witnessed fall, laceration to back of head.

## 2018-10-30 NOTE — ED Provider Notes (Addendum)
[facility] Emergency Department Provider Note  ____________________________________________   I have reviewed the triage vital signs and the nursing notes. Where available I have reviewed prior notes and, if possible and indicated, outside hospital notes.    HISTORY  Chief Complaint Alcohol Intoxication and Laceration  Patient seen and evaluated during the coronavirus epidemic during a time with low staffing Level 5 chart caveat; no further history available due to patient status.  HPI Vernon Bentley is a 58 y.o. male with a history of recurrent visits to the emergency room for EtOH.  EMS states that they know him well.  They believed to be intoxicated.  He was found down outside after apparently falling.  Has a bump on the head.  Was nonfocal with normal vitals and normal glucose for them.  His EMS is feeling the patient was not down for prolonged period of time as he was in a very heavily traffic area and he was seen they believe immediately after the fall.  Patient self cannot give a history   Past Medical History:  Diagnosis Date  . Alcohol abuse   . Ear infection   . Environmental allergies   . Hypertension     There are no active problems to display for this patient.   History reviewed. No pertinent surgical history.  Prior to Admission medications   Medication Sig Start Date End Date Taking? Authorizing Provider  busPIRone (BUSPAR) 15 MG tablet Take 15 mg by mouth 3 (three) times daily. 11/08/17   [provider]  chlordiazePOXIDE (LIBRIUM) 25 MG capsule 50 mg 3 times a day x 3 days 50 mg 2 times a day for 3 days 50 mg once a day for 3 days stop Patient not taking: Reported on 10/06/2017 08/27/17   Rudene Re, MD  [employer] MELATONIN 10 MG CAPS Take 10 mg by mouth at bedtime. 11/08/17   [provider]  disulfiram (ANTABUSE) 250 MG tablet Take 1 tablet (250 mg total) by mouth daily. Start taking 2 tablets a day for 2 weeks  then taper down to 1 tablet a day.  Make sure you have not had any alcohol for at least 12 hours before starting this medication.  Do not drink on this medication as you can become violently ill on it. 12/10/17   Nena Polio, MD  escitalopram (LEXAPRO) 20 MG tablet Take 20 mg by mouth daily. 11/08/17   [provider]  hydrochlorothiazide (HYDRODIURIL) 25 MG tablet Take 1 tablet (25 mg total) by mouth daily. Patient not taking: Reported on 10/06/2017 06/05/16   Lavonia Drafts, MD  losartan-hydrochlorothiazide (HYZAAR) 100-25 MG tablet Take 1 tablet by mouth daily. 09/27/17   [provider]  naltrexone (DEPADE) 50 MG tablet Take 50 mg by mouth daily. 11/08/17   [provider]  OXcarbazepine (TRILEPTAL) 150 MG tablet Take 150 mg by mouth 2 (two) times daily. 11/08/17   [provider]    Allergies Patient has no known allergies.  Family History  Problem Relation Age of Onset  . Hypertension Mother   . Hypertension Father     Social History Social History   Tobacco Use  . Smoking status: Never Smoker  . Smokeless tobacco: Current User  Substance Use Topics  . Alcohol use: Yes  . Drug use: No    Review of Systems Level 5 chart caveat; no further history available due to patient status.  ____________________________________________   PHYSICAL EXAM:  VITAL SIGNS: ED Triage Vitals [10/30/18 2025]  Enc Vitals Group     BP (!) 104/57     Pulse      Resp 17     Temp 98.7 F (37.1 C)     Temp Source Axillary     SpO2 90 %     Weight      Height      Head Circumference      Peak Flow      Pain Score      Pain Loc      Pain Edu?      Excl. in GC?     Constitutional: She is alert, intermittently follow commands but will not reliably give me answers.  Smells of alcohol. Eyes: Conjunctivae are normal Head: Abrasion noted to the occiput. HEENT: No congestion/rhinnorhea. Mucous membranes are moist.  Oropharynx non-erythematous Neck:    Nontender with no meningismus, no masses, no stridor Cardiovascular: Normal rate, regular rhythm. Grossly normal heart sounds.  Good peripheral circulation. Respiratory: Normal respiratory effort.  No retractions. Lungs CTAB. Abdominal: Soft and nontender. No distention. No guarding no rebound Back:  There is no focal tenderness or step off.  there is no midline tenderness there are no lesions noted. there is no CVA tenderness Musculoskeletal: No lower extremity tenderness, no upper extremity tenderness. No joint effusions, no DVT signs strong distal pulses no edema Neurologic: Neurologic exam, patient not participating Skin:  Skin is warm, dry and intact. No rash noted. Psychiatric: Mood and affect are normal. Speech and behavior are normal.  ____________________________________________   LABS (all labs ordered are listed, but only abnormal results are displayed)  Labs Reviewed  ETHANOL  URINALYSIS, COMPLETE (UACMP) WITH MICROSCOPIC  URINE DRUG SCREEN, QUALITATIVE ([facility] ONLY)  CBC WITH DIFFERENTIAL/PLATELET  COMPREHENSIVE METABOLIC PANEL  PROTIME-INR    Pertinent labs  results that were available during my care of the patient were reviewed by me and considered in my medical decision making (see chart for details). ____________________________________________  EKG  I personally interpreted any EKGs ordered by me or triage Sinus rhythm rate 96 bpm no acute ST elevation or depression baseline artifact noted ____________________________________________  RADIOLOGY  Pertinent labs & imaging results that were available during my care of the patient were reviewed by me and considered in my medical decision making (see chart for details). If possible, patient and/or family made aware of any abnormal findings.  No results found. ____________________________________________    PROCEDURES  Procedure(s) performed: None  .Marland Kitchen.Laceration Repair  Date/Time: 10/30/2018 11:17 PM Performed  by: Jeanmarie PlantMcShane,  A, MD Authorized by: Jeanmarie PlantMcShane,  A, MD   Consent:    Consent obtained:  Emergent situation   Consent given by:  Patient Anesthesia (see MAR for exact dosages):    Anesthesia method:  None Repair type:    Repair type:  Simple Pre-procedure details:    Preparation:  Patient was prepped and draped in usual sterile fashion Exploration:    Wound exploration: entire depth of wound probed and visualized     Contaminated: no   Treatment:    Area cleansed with:  Saline   Amount of cleaning:  Standard   Irrigation solution:  Sterile saline   Visualized foreign bodies/material removed: no   Skin repair:    Repair method:  Tissue adhesive Approximation:    Approximation:  Close Post-procedure details:    Dressing:  Open (no dressing)   Patient tolerance of procedure:  Tolerated well, no immediate complications    Critical Care performed: None  ____________________________________________   INITIAL  IMPRESSION / ASSESSMENT AND PLAN / ED COURSE  Pertinent labs & imaging results that were available during my care of the patient were reviewed by me and considered in my medical decision making (see chart for details).  Patient here after a fall and head injury, differential includes head bleed and seizure although these were not reported.  He is not obviously focal in his neurologic exam although there is some limitation to it given his mental status we are emergently obtaining CT scan of the head and neck, no evidence of other trauma noted on him abdomen is benign lungs are clear, he is alert and awake, does not require intubation.  EMS believes him to be intoxicated.  Will check alcohol basic blood work and continue to monitor him here.    ____________________________________________   FINAL CLINICAL IMPRESSION(S) / ED DIAGNOSES  Final diagnoses:  None      This chart was dictated using voice recognition software.  Despite best efforts to proofread,  errors  can occur which can change meaning.      Jeanmarie PlantMcShane,  A, MD 10/30/18 2043    Jeanmarie PlantMcShane,  A, MD 10/30/18 [phone]

## 2018-10-30 NOTE — ED Provider Notes (Signed)
-----------------------------------------   11:09 PM on 10/30/2018 -----------------------------------------   Assuming care from Dr. Burlene Arnt.  In short, Vernon Bentley is a 58 y.o. male with a chief complaint of alcohol intoxication and fall.  Refer to the original H&P for additional details.  The current plan of care is to observe until clnically sober.    ----------------------------------------- 12:37 AM on 10/31/2018 -----------------------------------------  Patient is agitated and combative and trying to get out of bed in spite of ETOH > 400.  CIWA is 16.  Giving Ativan 2 mg IV, sitter is at bedside.    ED ECG REPORT I, Hinda Kehr, the attending physician, personally viewed and interpreted this ECG.  Date: 10/30/2018 EKG Time: 20:37 Rate: 96 Rhythm: atrial paced complexes QRS Axis: normal Intervals: normal.  QTC is within normal limits of 441 ms. ST/T Wave abnormalities: Non-specific ST segment / T-wave changes, but no clear evidence of acute ischemia.  There is a significant amount of motion artifact because the patient would not hold still which is evident mostly in leads I, II and III but otherwise the EKG is reassuring. Narrative Interpretation: no definitive evidence of acute ischemia; does not meet STEMI criteria.     Hinda Kehr, MD 10/31/18 [phone]

## 2018-10-30 NOTE — ED Notes (Signed)
Off unit to ct 

## 2018-10-31 LAB — URINALYSIS, COMPLETE (UACMP) WITH MICROSCOPIC
Bacteria, UA: NONE SEEN
Bilirubin Urine: NEGATIVE
Glucose, UA: NEGATIVE mg/dL
Hgb urine dipstick: NEGATIVE
Ketones, ur: NEGATIVE mg/dL
Leukocytes,Ua: NEGATIVE
Nitrite: NEGATIVE
Protein, ur: NEGATIVE mg/dL
Specific Gravity, Urine: 1.009 (ref 1.005–1.030)
pH: 5 (ref 5.0–8.0)

## 2018-10-31 LAB — URINE DRUG SCREEN, QUALITATIVE (ARMC ONLY)
Amphetamines, Ur Screen: NOT DETECTED
Barbiturates, Ur Screen: NOT DETECTED
Benzodiazepine, Ur Scrn: NOT DETECTED
Cannabinoid 50 Ng, Ur ~~LOC~~: NOT DETECTED
Cocaine Metabolite,Ur ~~LOC~~: NOT DETECTED
MDMA (Ecstasy)Ur Screen: NOT DETECTED
Methadone Scn, Ur: NOT DETECTED
Opiate, Ur Screen: NOT DETECTED
Phencyclidine (PCP) Ur S: NOT DETECTED
Tricyclic, Ur Screen: NOT DETECTED

## 2018-10-31 NOTE — ED Notes (Signed)
Pt resting at this time.

## 2018-10-31 NOTE — ED Notes (Addendum)
Wife here to pick up pt, pt in [facility] to lobby , NAD noted

## 2018-10-31 NOTE — ED Notes (Signed)
Pt sitting up drinking water at this time

## 2018-10-31 NOTE — ED Notes (Signed)
Pt sitting up and dressing at this time, NAD noted

## 2018-10-31 NOTE — ED Notes (Signed)
Pt A/O x3. Pt denies pain at this time. This Rn will continue to mointor pt.

## 2019-12-03 IMAGING — CT CT HEAD WITHOUT CONTRAST
5 of 8 series · 17 of 47 positions shown, 18 images · non-contrast
Comparison: 12/10/2017

CLINICAL DATA: Recent falls and possible intoxication

EXAM:
CT HEAD WITHOUT CONTRAST
CT CERVICAL SPINE WITHOUT CONTRAST
TECHNIQUE: Multidetector CT imaging of the head and cervical spine was
performed following the standard protocol without intravenous
contrast. Multiplanar CT image reconstructions of the cervical spine
were also generated.

[Series 3: head wo · axial · 0.47mm/px · z∈[-131,-51]mm · 3 of 33 slices shown, 4 images]
[im 9/33  brain]
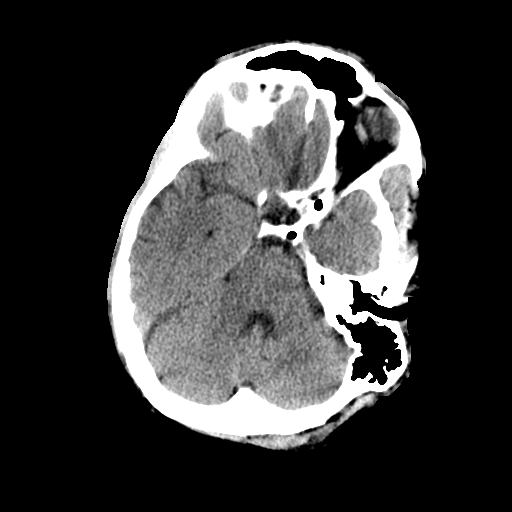
[im 9/33  bone]
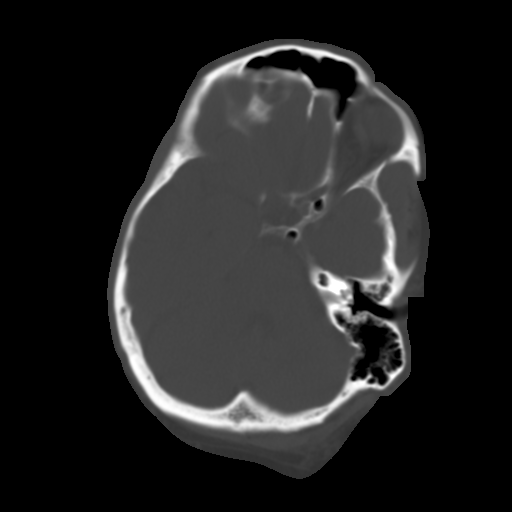
[im 17/33  brain]
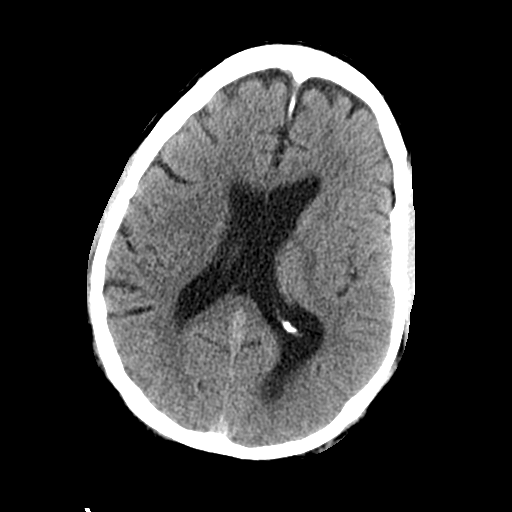
[im 25/33  brain]
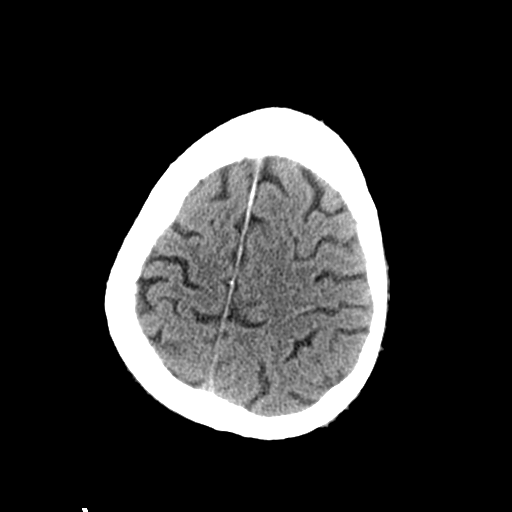

[Series 4: coronal soft tissue · coronal · 0.31mm/px · 3 of 69 slices shown]
[im 26/69  brain]
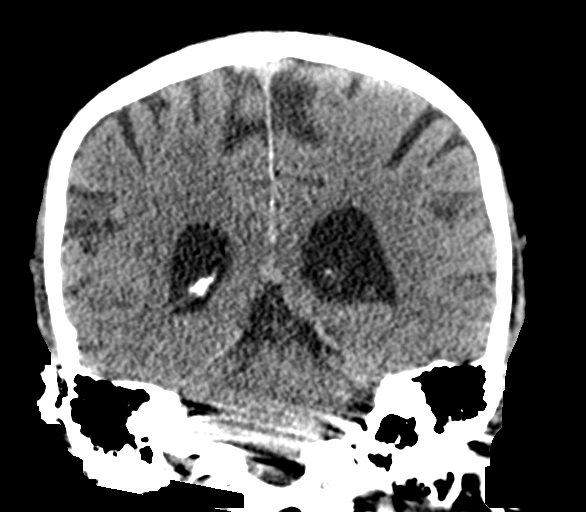
[im 35/69  brain]
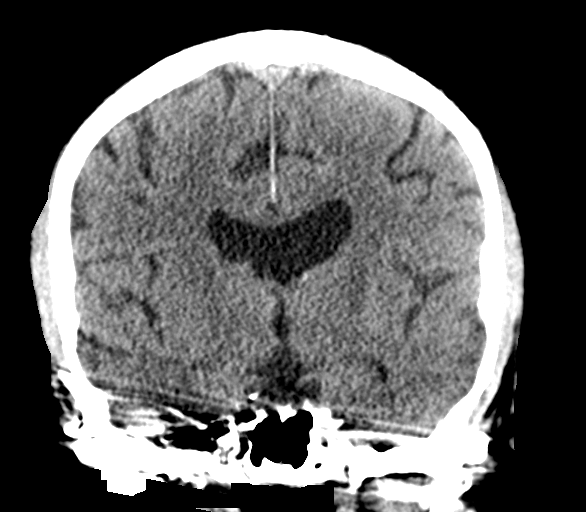
[im 43/69  brain]
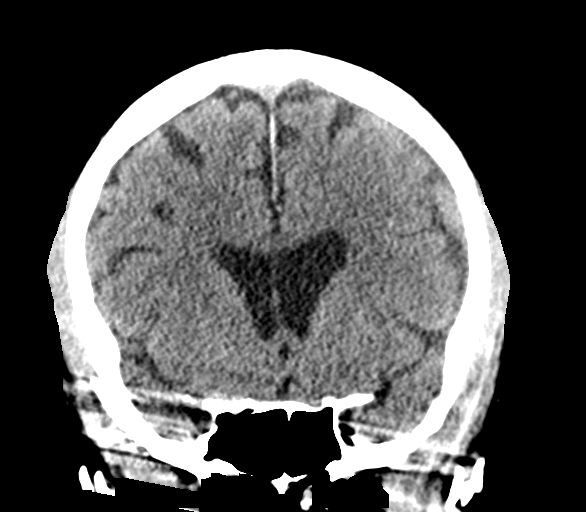

[Series 5: sagittal soft tissue · sagittal · 0.33mm/px · 1 of 55 slices shown]
[im 28/55  brain]
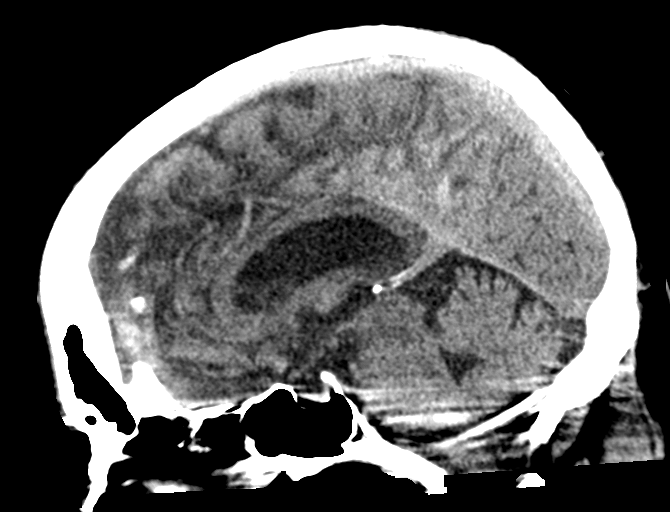

[Series 9: c spine soft · axial · 0.31mm/px · z∈[-322,-304]mm · 2 of 91 slices shown]
[im 10/91  brain]
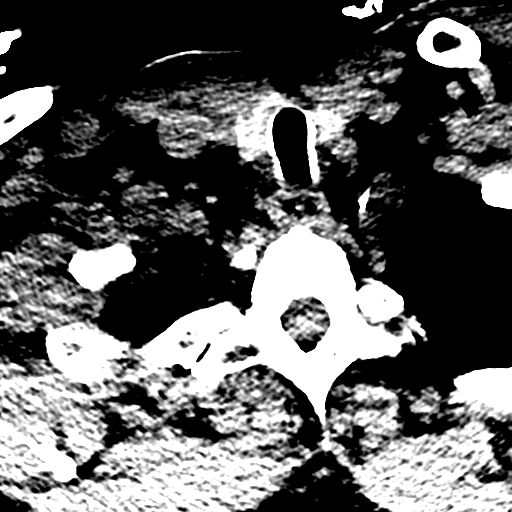
[im 19/91  brain]
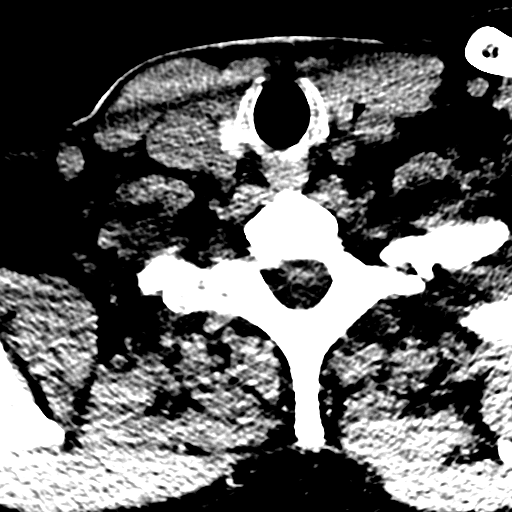

[Series 12: orthogonal bone · axial · 0.27mm/px · z∈[-362,-196]mm · 8 of 105 slices shown]
[im 9/105  bone]
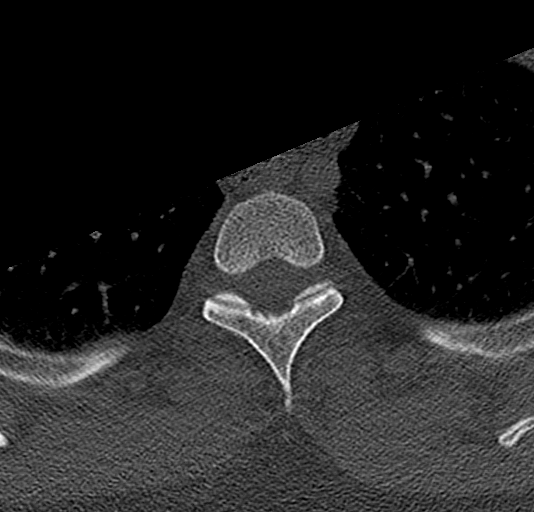
[im 27/105  bone]
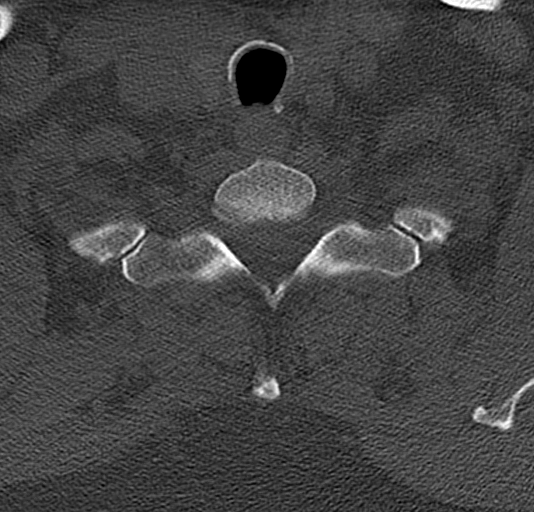
[im 35/105  bone]
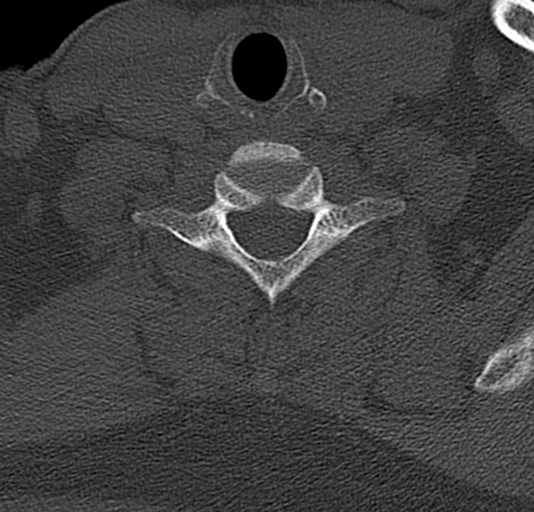
[im 44/105  bone]
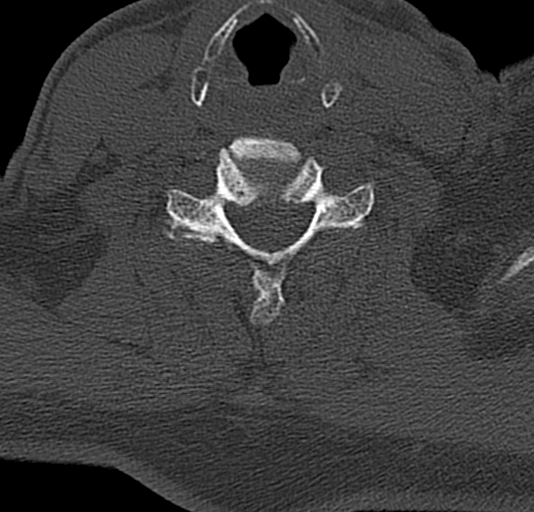
[im 61/105  bone]
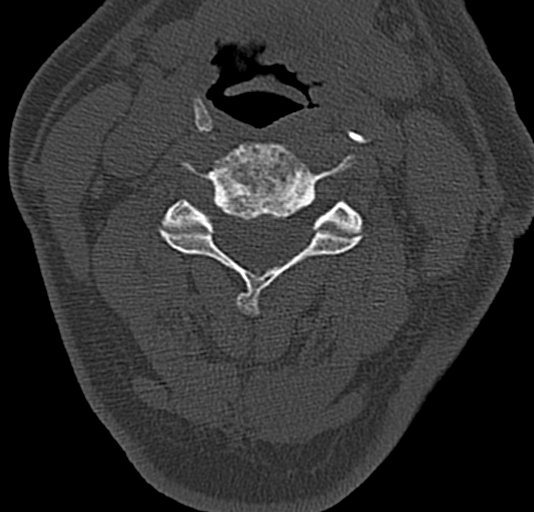
[im 70/105  bone]
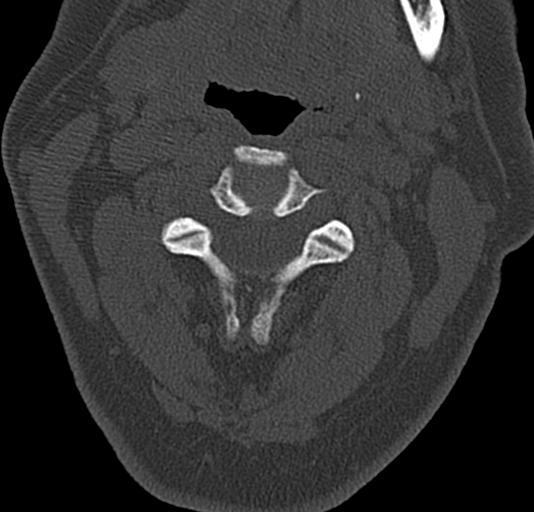
[im 79/105  bone]
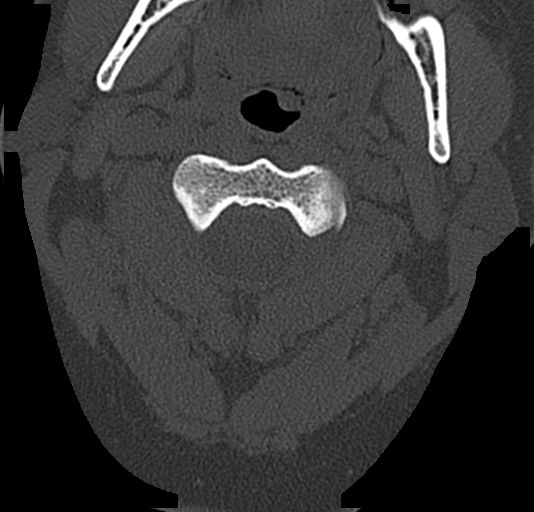
[im 96/105  bone]
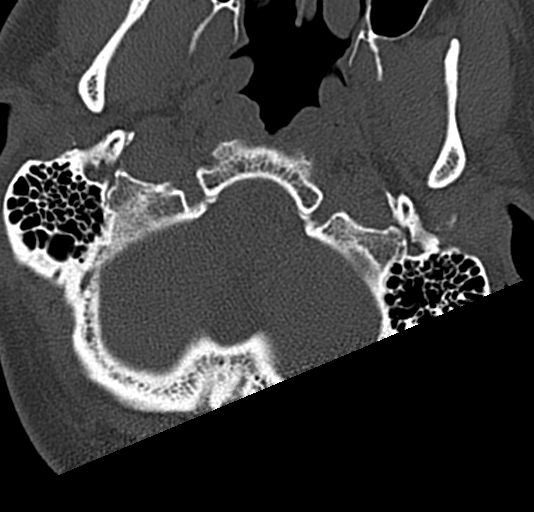

[17 of 47 positions shown; findings below may reference images not displayed]

FINDINGS: CT HEAD FINDINGS

Brain: Images are significantly limited by patient motion artifact.
No findings to suggest acute hemorrhage, acute infarct or
space-occupying mass lesion are seen.

Vascular: No hyperdense vessel or unexpected calcification.

Skull: Normal. Negative for fracture or focal lesion.

Sinuses/Orbits: No acute finding.

Other: None.

CT CERVICAL SPINE FINDINGS

Alignment: Within normal limits.

Skull base and vertebrae: 7 cervical segments are well visualized.
Vertebral body height is well maintained. Minimal osteophytic
changes are seen. No acute fracture or acute facet abnormality is
noted.

Soft tissues and spinal canal: Surrounding soft tissue structures
are within normal limits.

Upper chest: Visualized lung apices are unremarkable.

Other: None
IMPRESSION: CT of the head: No acute abnormality noted.

CT of the cervical spine: Mild degenerative change without acute
abnormality.

## 2020-03-12 ENCOUNTER — Other Ambulatory Visit: Payer: Self-pay

## 2020-03-12 ENCOUNTER — Emergency Department
Admission: EM | Admit: 2020-03-12 | Discharge: 2020-03-12 | Disposition: A | Discharge status: Home / Self Care | Payer: BC Managed Care – PPO | Attending: Emergency Medicine | Admitting: Emergency Medicine

## 2020-03-12 ENCOUNTER — Encounter: Payer: Self-pay | Admitting: Emergency Medicine

## 2020-03-12 DIAGNOSIS — R112 Nausea with vomiting, unspecified: Secondary | ICD-10-CM | POA: Diagnosis present

## 2020-03-12 DIAGNOSIS — R197 Diarrhea, unspecified: Secondary | ICD-10-CM | POA: Diagnosis not present

## 2020-03-12 DIAGNOSIS — I10 Essential (primary) hypertension: Secondary | ICD-10-CM | POA: Diagnosis not present

## 2020-03-12 DIAGNOSIS — E860 Dehydration: Secondary | ICD-10-CM | POA: Diagnosis not present

## 2020-03-12 DIAGNOSIS — Z79899 Other long term (current) drug therapy: Secondary | ICD-10-CM | POA: Insufficient documentation

## 2020-03-12 LAB — URINALYSIS, COMPLETE (UACMP) WITH MICROSCOPIC
Bacteria, UA: NONE SEEN
Bilirubin Urine: NEGATIVE
Glucose, UA: NEGATIVE mg/dL
Hgb urine dipstick: NEGATIVE
Ketones, ur: 80 mg/dL — AB
Leukocytes,Ua: NEGATIVE
Nitrite: NEGATIVE
Protein, ur: 30 mg/dL — AB
Specific Gravity, Urine: 1.023 (ref 1.005–1.030)
Squamous Epithelial / HPF: NONE SEEN (ref 0–5)
pH: 5 (ref 5.0–8.0)

## 2020-03-12 LAB — CBC
HCT: 42.6 % (ref 39.0–52.0)
Hemoglobin: 14.8 g/dL (ref 13.0–17.0)
MCH: 34.7 pg — ABNORMAL HIGH (ref 26.0–34.0)
MCHC: 34.7 g/dL (ref 30.0–36.0)
MCV: 99.8 fL (ref 80.0–100.0)
Platelets: 342 10*3/uL (ref 150–400)
RBC: 4.27 MIL/uL (ref 4.22–5.81)
RDW: 13.1 % (ref 11.5–15.5)
WBC: 9.6 10*3/uL (ref 4.0–10.5)
nRBC: 0 % (ref 0.0–0.2)

## 2020-03-12 LAB — COMPREHENSIVE METABOLIC PANEL
ALT: 54 U/L — ABNORMAL HIGH (ref 0–44)
AST: 49 U/L — ABNORMAL HIGH (ref 15–41)
Albumin: 4.5 g/dL (ref 3.5–5.0)
Alkaline Phosphatase: 82 U/L (ref 38–126)
Anion gap: 24 — ABNORMAL HIGH (ref 5–15)
BUN: 29 mg/dL — ABNORMAL HIGH (ref 6–20)
CO2: 17 mmol/L — ABNORMAL LOW (ref 22–32)
Calcium: 9.8 mg/dL (ref 8.9–10.3)
Chloride: 94 mmol/L — ABNORMAL LOW (ref 98–111)
Creatinine, Ser: 1.51 mg/dL — ABNORMAL HIGH (ref 0.61–1.24)
GFR, Estimated: 53 mL/min — ABNORMAL LOW (ref >60)
Glucose, Bld: 120 mg/dL — ABNORMAL HIGH (ref 70–99)
Potassium: 4.9 mmol/L (ref 3.5–5.1)
Sodium: 135 mmol/L (ref 135–145)
Total Bilirubin: 1.7 mg/dL — ABNORMAL HIGH (ref 0.3–1.2)
Total Protein: 8.1 g/dL (ref 6.5–8.1)

## 2020-03-12 LAB — BASIC METABOLIC PANEL
Anion gap: 15 (ref 5–15)
BUN: 26 mg/dL — ABNORMAL HIGH (ref 6–20)
CO2: 20 mmol/L — ABNORMAL LOW (ref 22–32)
Calcium: 8.1 mg/dL — ABNORMAL LOW (ref 8.9–10.3)
Chloride: 99 mmol/L (ref 98–111)
Creatinine, Ser: 1.16 mg/dL (ref 0.61–1.24)
GFR, Estimated: >60 mL/min (ref >60)
Glucose, Bld: 119 mg/dL — ABNORMAL HIGH (ref 70–99)
Potassium: 4.7 mmol/L (ref 3.5–5.1)
Sodium: 134 mmol/L — ABNORMAL LOW (ref 135–145)

## 2020-03-12 LAB — ETHANOL: Alcohol, Ethyl (B): <10 mg/dL (ref <10)

## 2020-03-12 LAB — LIPASE, BLOOD: Lipase: 16 U/L (ref 11–51)

## 2020-03-12 MED ORDER — SODIUM CHLORIDE 0.9 % IV BOLUS
1000.0000 mL | Freq: Once | INTRAVENOUS | Status: AC
  Administered 2020-03-12: 1000 mL via INTRAVENOUS

## 2020-03-12 MED ORDER — ONDANSETRON 8 MG PO TBDP: 8.0000 mg | ORAL_TABLET | Freq: Three times a day (TID) | ORAL | 0 refills | Status: AC | PRN

## 2020-03-12 MED ORDER — ONDANSETRON HCL 4 MG/2ML IJ SOLN
4.0000 mg | Freq: Once | INTRAMUSCULAR | Status: AC
  Administered 2020-03-12: 4 mg via INTRAVENOUS
  Filled 2020-03-12: qty 2

## 2020-03-12 NOTE — ED Provider Notes (Signed)
[facility] Emergency Department Provider Note ____________________________________________   First MD Initiated Contact with Patient 03/12/20 1535     (approximate)  I have reviewed the triage vital signs and the nursing notes.   HISTORY  Chief Complaint Nausea    HPI Vernon Bentley is a 59 y.o. male with PMH as noted below including history of alcohol abuse who presents with nausea and vomiting since yesterday, somewhat improving today, and associated with diarrhea but not with abdominal pain.  The patient states that he feels he is in alcohol withdrawal.  He reports that he does not drink daily, but once every few months will have a binge where he drinks heavily for several days.  This is what happened currently.  His last drink was yesterday afternoon.  He reports feeling somewhat weak and shaky.  He denies any hallucinations.  Past Medical History:  Diagnosis Date  . Alcohol abuse   . Ear infection   . Environmental allergies   . Hypertension     There are no problems to display for this patient.   History reviewed. No pertinent surgical history.  Prior to Admission medications   Medication Sig Start Date End Date Taking? Authorizing Provider  busPIRone (BUSPAR) 15 MG tablet Take 15 mg by mouth 3 (three) times daily. 11/08/17   [provider]  chlordiazePOXIDE (LIBRIUM) 25 MG capsule 50 mg 3 times a day x 3 days 50 mg 2 times a day for 3 days 50 mg once a day for 3 days stop Patient not taking: Reported on 10/06/2017 08/27/17   Nita Sickle, MD  [employer] MELATONIN 10 MG CAPS Take 10 mg by mouth at bedtime. 11/08/17   [provider]  disulfiram (ANTABUSE) 250 MG tablet Take 1 tablet (250 mg total) by mouth daily. Start taking 2 tablets a day for 2 weeks then taper down to 1 tablet a day.  Make sure you have not had any alcohol for at least 12 hours before starting this medication.  Do not drink on this medication as you can become  violently ill on it. Patient not taking: Reported on 10/30/2018 12/10/17   Arnaldo Natal, MD  escitalopram (LEXAPRO) 20 MG tablet Take 20 mg by mouth daily. 11/08/17   [provider]  hydrochlorothiazide (HYDRODIURIL) 25 MG tablet Take 1 tablet (25 mg total) by mouth daily. Patient not taking: Reported on 10/06/2017 06/05/16   Jene Every, MD  losartan-hydrochlorothiazide (HYZAAR) 100-25 MG tablet Take 1 tablet by mouth daily. 09/27/17   [provider]  naltrexone (DEPADE) 50 MG tablet Take 50 mg by mouth daily. 11/08/17   [provider]  ondansetron (ZOFRAN ODT) 8 MG disintegrating tablet Take 1 tablet (8 mg total) by mouth every 8 (eight) hours as needed. 03/12/20   Dionne Bucy, MD  OXcarbazepine (TRILEPTAL) 150 MG tablet Take 150 mg by mouth 2 (two) times daily. 11/08/17   [provider]    Allergies Patient has no known allergies.  Family History  Problem Relation Age of Onset  . Hypertension Mother   . Hypertension Father     Social History Social History   Tobacco Use  . Smoking status: Never Smoker  . Smokeless tobacco: Current User  Substance Use Topics  . Alcohol use: Yes  . Drug use: No    Review of Systems  Constitutional: No fever/chills Eyes: No visual changes. ENT: No sore throat. Cardiovascular: Denies chest pain. Respiratory: Denies shortness of breath. Gastrointestinal: Positive for nausea  and vomiting. Genitourinary: Negative for dysuria.  Musculoskeletal: Negative for back pain. Skin: Negative for rash. Neurological: Negative for headache.   ____________________________________________   PHYSICAL EXAM:  VITAL SIGNS: ED Triage Vitals [03/12/20 1302]  Enc Vitals Group     BP (!) 178/70     Pulse Rate 96     Resp 16     Temp 99.1 F (37.3 C)     Temp Source Oral     SpO2 99 %     Weight 149 lb 14.6 oz (68 kg)     Height 5\' 7"  (1.702 m)     Head Circumference      Peak Flow      Pain Score 0      Pain Loc      Pain Edu?      Excl. in GC?     Constitutional: Alert and oriented.  Relatively well appearing and in no acute distress. Eyes: Conjunctivae are normal.  Head: Atraumatic. Nose: No congestion/rhinnorhea. Mouth/Throat: Mucous membranes are dry. Neck: Normal range of motion.  Cardiovascular: Normal rate, regular rhythm.   Good peripheral circulation. Respiratory: Normal respiratory effort.  No retractions.  Gastrointestinal: Soft and nontender. No distention.  Genitourinary: No flank tenderness. Musculoskeletal: No lower extremity edema.  Extremities warm and well perfused.  Neurologic:  Normal speech and language. No gross focal neurologic deficits are appreciated.  No significant tremor or tongue fasciculation. Skin:  Skin is warm and dry. No rash noted. Psychiatric: Mood and affect are normal. Speech and behavior are normal.  ____________________________________________   LABS (all labs ordered are listed, but only abnormal results are displayed)  Labs Reviewed  COMPREHENSIVE METABOLIC PANEL - Abnormal; Notable for the following components:      Result Value   Chloride 94 (*)    CO2 17 (*)    Glucose, Bld 120 (*)    BUN 29 (*)    Creatinine, Ser 1.51 (*)    AST 49 (*)    ALT 54 (*)    Total Bilirubin 1.7 (*)    GFR, Estimated 53 (*)    Anion gap 24 (*)    All other components within normal limits  CBC - Abnormal; Notable for the following components:   MCH 34.7 (*)    All other components within normal limits  URINALYSIS, COMPLETE (UACMP) WITH MICROSCOPIC - Abnormal; Notable for the following components:   Color, Urine YELLOW (*)    APPearance CLEAR (*)    Ketones, ur 80 (*)    Protein, ur 30 (*)    All other components within normal limits  BASIC METABOLIC PANEL - Abnormal; Notable for the following components:   Sodium 134 (*)    CO2 20 (*)    Glucose, Bld 119 (*)    BUN 26 (*)    Calcium 8.1 (*)    All other components within normal limits   LIPASE, BLOOD  ETHANOL   ____________________________________________  EKG   ____________________________________________  RADIOLOGY    ____________________________________________   PROCEDURES  Procedure(s) performed: No  Procedures  Critical Care performed: No ____________________________________________   INITIAL IMPRESSION / ASSESSMENT AND PLAN / ED COURSE  Pertinent labs & imaging results that were available during my care of the patient were reviewed by me and considered in my medical decision making (see chart for details).  59 year old male with PMH as noted above presents with nausea and vomiting after a recent alcohol binge which ended yesterday afternoon.  He reports feeling shaky and  like he is withdrawing from alcohol.  I reviewed the past medical records in Epic.  The patient has had several prior ED visits for alcohol abuse, but has not been admitted recently.  On exam currently, he is overall well-appearing.  His vital signs are normal except for hypertension.  He has no significant tremor or tongue fasciculation.  Overall presentation is consistent with symptoms related to recent alcohol abuse, likely some element of gastritis.  The patient does not appear to be in significant withdrawal requiring IV benzodiazepines.  Initial labs show elevated creatinine and anion gap consistent with dehydration and possible AKA.  We will give fluids and Zofran and recheck the chemistry.  ----------------------------------------- 6:55 PM on 03/12/2020 -----------------------------------------  The patient is feeling much better after fluids and Zofran.  He was able to drink and eat without any vomiting.  Repeat BMP after 2 L of NS shows a normalized anion gap and improved bicarb.  There is no evidence of AKA.  There is also no evidence of significant alcohol withdrawal.  At this time, the patient is stable for discharge home.  He feels well and wants to go home.  I  counseled him on the results of the work-up.  Return precautions given, and he expresses understanding. ____________________________________________   FINAL CLINICAL IMPRESSION(S) / ED DIAGNOSES  Final diagnoses:  Non-intractable vomiting with nausea, unspecified vomiting type  Dehydration      NEW MEDICATIONS STARTED DURING THIS VISIT:  New Prescriptions   ONDANSETRON (ZOFRAN ODT) 8 MG DISINTEGRATING TABLET    Take 1 tablet (8 mg total) by mouth every 8 (eight) hours as needed.     Note:  This document was prepared using Dragon voice recognition software and may include unintentional dictation errors.   Dionne Bucy, MD 03/12/20 [phone]

## 2020-03-12 NOTE — Discharge Instructions (Signed)
Take the Zofran as needed for nausea.  Make sure to drink plenty of fluids and gradually advance your diet.  Avoid alcohol.  Return to the ER for new, worsening, or persistent severe nausea or vomiting, abdominal pain, weakness, or any other new or worsening symptoms that concern you.

## 2020-03-12 NOTE — ED Notes (Signed)
Signature pad not functioning. AVS given to patient, pt verbalized understanding with no questions/concerns.

## 2020-03-12 NOTE — ED Triage Notes (Signed)
C/O nausea due to 'alcohol withdrawal.  States last drank yesterday.  States he binge drinks, not a daily intake.  AAOx3.  Skin warm and dry. NAD  Calm and cooperative.  Denies SI/ H I
